# Patient Record
Sex: Female | Born: 1945 | Race: White | Hispanic: No | Marital: Married | Smoking: Never smoker
Health system: Southern US, Community
[De-identification: ages and names within clinical notes are randomized; demographics above are authoritative.]

## PROBLEM LIST (undated history)

## (undated) DIAGNOSIS — I10 Essential (primary) hypertension: Secondary | ICD-10-CM

## (undated) DIAGNOSIS — E039 Hypothyroidism, unspecified: Secondary | ICD-10-CM

## (undated) DIAGNOSIS — F32A Depression, unspecified: Secondary | ICD-10-CM

## (undated) DIAGNOSIS — K219 Gastro-esophageal reflux disease without esophagitis: Secondary | ICD-10-CM

## (undated) DIAGNOSIS — M1990 Unspecified osteoarthritis, unspecified site: Secondary | ICD-10-CM

## (undated) DIAGNOSIS — R7303 Prediabetes: Secondary | ICD-10-CM

## (undated) DIAGNOSIS — E079 Disorder of thyroid, unspecified: Secondary | ICD-10-CM

## (undated) DIAGNOSIS — F419 Anxiety disorder, unspecified: Secondary | ICD-10-CM

## (undated) HISTORY — PX: TONSILLECTOMY AND ADENOIDECTOMY: SUR1326

## (undated) HISTORY — DX: Disorder of thyroid, unspecified: E07.9

## (undated) HISTORY — DX: Gastro-esophageal reflux disease without esophagitis: K21.9

## (undated) HISTORY — PX: ESOPHAGOGASTRODUODENOSCOPY ENDOSCOPY: SHX5814

## (undated) HISTORY — PX: CARPAL TUNNEL RELEASE: SHX101

## (undated) HISTORY — PX: TUBAL LIGATION: SHX77

## (undated) HISTORY — PX: COLONOSCOPY: SHX174

## (undated) HISTORY — PX: CATARACT EXTRACTION, BILATERAL: SHX1313

## (undated) HISTORY — PX: NASAL SINUS SURGERY: SHX719

## (undated) HISTORY — PX: AXILLARY LYMPH NODE BIOPSY: SHX5737

---

## 1998-02-24 ENCOUNTER — Ambulatory Visit (HOSPITAL_COMMUNITY): Admission: RE | Admit: 1998-02-24 | Discharge: 1998-02-24 | Payer: Self-pay | Admitting: Obstetrics and Gynecology

## 1999-01-31 ENCOUNTER — Other Ambulatory Visit: Admission: RE | Admit: 1999-01-31 | Discharge: 1999-01-31 | Payer: Self-pay | Admitting: Obstetrics and Gynecology

## 1999-10-24 ENCOUNTER — Ambulatory Visit (HOSPITAL_COMMUNITY): Admission: RE | Admit: 1999-10-24 | Discharge: 1999-10-24 | Payer: Self-pay | Admitting: Obstetrics and Gynecology

## 1999-10-24 ENCOUNTER — Encounter: Payer: Self-pay | Admitting: Obstetrics and Gynecology

## 2000-03-13 ENCOUNTER — Other Ambulatory Visit: Admission: RE | Admit: 2000-03-13 | Discharge: 2000-03-13 | Payer: Self-pay | Admitting: Obstetrics and Gynecology

## 2000-07-10 ENCOUNTER — Ambulatory Visit (HOSPITAL_BASED_OUTPATIENT_CLINIC_OR_DEPARTMENT_OTHER): Admission: RE | Admit: 2000-07-10 | Discharge: 2000-07-10 | Payer: Self-pay | Admitting: Orthopedic Surgery

## 2001-03-18 ENCOUNTER — Other Ambulatory Visit: Admission: RE | Admit: 2001-03-18 | Discharge: 2001-03-18 | Payer: Self-pay | Admitting: Obstetrics and Gynecology

## 2001-05-08 ENCOUNTER — Ambulatory Visit (HOSPITAL_COMMUNITY): Admission: RE | Admit: 2001-05-08 | Discharge: 2001-05-08 | Payer: Self-pay | Admitting: Gastroenterology

## 2001-05-08 ENCOUNTER — Encounter (INDEPENDENT_AMBULATORY_CARE_PROVIDER_SITE_OTHER): Payer: Self-pay | Admitting: *Deleted

## 2002-05-06 ENCOUNTER — Other Ambulatory Visit: Admission: RE | Admit: 2002-05-06 | Discharge: 2002-05-06 | Payer: Self-pay | Admitting: Obstetrics and Gynecology

## 2002-07-30 ENCOUNTER — Ambulatory Visit (HOSPITAL_COMMUNITY): Admission: RE | Admit: 2002-07-30 | Discharge: 2002-07-30 | Payer: Self-pay | Admitting: Obstetrics and Gynecology

## 2002-07-30 ENCOUNTER — Encounter: Payer: Self-pay | Admitting: Obstetrics and Gynecology

## 2002-12-03 ENCOUNTER — Encounter: Payer: Self-pay | Admitting: Allergy and Immunology

## 2002-12-03 ENCOUNTER — Ambulatory Visit (HOSPITAL_COMMUNITY): Admission: RE | Admit: 2002-12-03 | Discharge: 2002-12-03 | Payer: Self-pay | Admitting: *Deleted

## 2003-05-29 ENCOUNTER — Other Ambulatory Visit: Admission: RE | Admit: 2003-05-29 | Discharge: 2003-05-29 | Payer: Self-pay | Admitting: Obstetrics and Gynecology

## 2003-06-08 ENCOUNTER — Encounter: Payer: Self-pay | Admitting: Obstetrics and Gynecology

## 2003-06-08 ENCOUNTER — Ambulatory Visit (HOSPITAL_COMMUNITY): Admission: RE | Admit: 2003-06-08 | Discharge: 2003-06-08 | Payer: Self-pay | Admitting: Obstetrics and Gynecology

## 2003-08-19 ENCOUNTER — Ambulatory Visit (HOSPITAL_COMMUNITY): Admission: RE | Admit: 2003-08-19 | Discharge: 2003-08-19 | Payer: Self-pay | Admitting: Obstetrics and Gynecology

## 2004-02-03 ENCOUNTER — Ambulatory Visit (HOSPITAL_COMMUNITY): Admission: RE | Admit: 2004-02-03 | Discharge: 2004-02-03 | Payer: Self-pay | Admitting: Surgery

## 2004-03-26 ENCOUNTER — Emergency Department (HOSPITAL_COMMUNITY): Admission: EM | Admit: 2004-03-26 | Discharge: 2004-03-26 | Payer: Self-pay | Admitting: *Deleted

## 2004-10-07 ENCOUNTER — Other Ambulatory Visit: Admission: RE | Admit: 2004-10-07 | Discharge: 2004-10-07 | Payer: Self-pay | Admitting: Obstetrics and Gynecology

## 2005-08-20 ENCOUNTER — Emergency Department (HOSPITAL_COMMUNITY): Admission: EM | Admit: 2005-08-20 | Discharge: 2005-08-20 | Payer: Self-pay | Admitting: Emergency Medicine

## 2005-10-24 ENCOUNTER — Other Ambulatory Visit: Admission: RE | Admit: 2005-10-24 | Discharge: 2005-10-24 | Payer: Self-pay | Admitting: Obstetrics and Gynecology

## 2006-11-19 ENCOUNTER — Ambulatory Visit (HOSPITAL_COMMUNITY): Admission: RE | Admit: 2006-11-19 | Discharge: 2006-11-19 | Payer: Self-pay | Admitting: Orthopedic Surgery

## 2009-05-24 ENCOUNTER — Encounter: Admission: RE | Admit: 2009-05-24 | Discharge: 2009-05-24 | Payer: Self-pay | Admitting: Occupational Medicine

## 2010-02-08 ENCOUNTER — Emergency Department (HOSPITAL_COMMUNITY): Admission: EM | Admit: 2010-02-08 | Discharge: 2010-02-08 | Payer: Self-pay | Admitting: Family Medicine

## 2011-03-03 NOTE — Procedures (Signed)
[place]. [facility]  Patient:    FIZA, NATION                       MRN: [id number] Proc. Date: 05/08/01 Attending:  Florencia Reasons, M.D. CC:         Dr. Nadine Counts, [place]  Juluis Mire, M.D.   Procedure Report  PROCEDURE:  Colonoscopy.  INDICATION:  A 65 year old female for colon cancer screening.  FINDINGS:  Mild melanosis coli.  Otherwise normal to terminal ileum.  DESCRIPTION OF PROCEDURE:  The nature, purpose, and risks of the procedure had been discussed with the patient, who provided written consent.  Sedation for this procedure and the upper endoscopy which preceded it totaled fentanyl 60 mcg and Versed 7 mg IV, without arrhythmias or desaturation.  The Olympus adjustable-tension pediatric video colonoscope was advanced to the terminal ileum without significant difficulty, and pullback was then performed.  The quality of the prep was excellent, and it is felt that all areas were well-seen.  The terminal ileum was normal in appearance.  The colon had mild melanosis coli, most prominent in the proximal colon.  No polyps, cancer, colitis, vascular malformations, or diverticulosis were noted.  Retroflexion in the rectum showed internal hemorrhoids, which were also somewhat visible during pull-out through the anal canal.  The patient tolerated the procedure well, and there were no apparent complications.  IMPRESSION:  Mild melanosis coli, otherwise unremarkable examination.  PLAN:  Consider follow-up screening exam of some sort (flexible sigmoidoscopy or colonoscopy) in approximately five years. DD:  05/08/01 TD:  05/08/01 Job: [id number]

## 2011-03-03 NOTE — Procedures (Signed)
[place]. [facility]  Patient:    Rhonda Blair, Rhonda Blair                       MRN: [id number] Proc. Date: 05/08/01 Attending:  Florencia Reasons, M.D. CC:         Nadine Counts, MD  Juluis Mire, M.D.   Procedure Report  PROCEDURE PERFORMED:  Upper endoscopy.  ENDOSCOPIST:  Florencia Reasons, M.D.  INDICATIONS FOR PROCEDURE:  Refractory reflux-type symptoms in a 65 year old female, not responding fully to Nexium therapy.  FINDINGS:  Normal exam.  DESCRIPTION OF PROCEDURE:  The nature, purpose and risks of the procedure had been discussed with the patient, who provided written consent.  Sedation for this procedure was fentanyl 40 mcg and Versed 5 mg IV without arrhythmias or desaturation.  The Olympus adult video endoscope was passed under direct vision.  The larynx and vocal cords looked very normal without any evidence of bogginess, edema or other inflammatory changes to suggest reflux laryngitis. The esophagus was entered under direct vision and had normal mucosa right down to the squamocolumnar junction which was located at the level of the diaphragm.  Specifically, there was no evidence of reflux esophagitis, Barretts esophagus, varices, infection or neoplasia.  I did not appreciate any ring, stricture or hiatal hernia.  There was a little bit of bumpiness of the mucosa, possibly constituting glycogen acanthosis just above the squamocolumnar junction or conceivably this could represent some sort of postinflammatory changes, so I did take biopsies from this area at the conclusion of the procedure.  The stomach contained no significant residual and had normal mucosa, without evidence of gastritis, erosions, ulcers, polyps or masses, and the pylorus, duodenal bulb and second duodenum looked normal. Random duodenal and antral biopsies were also obtained in view of the unexplained ongoing symptoms in this patient prior to removal of the scope. The  patient tolerated the procedure well and there were no apparent complications.  IMPRESSION:  Normal upper endoscopy.  No source of symptoms endoscopically evident.  PLAN: 1. Await pathology on biopsies. 2. Proceed to colonoscopic evaluation for screening purposes. DD:  05/08/01 TD:  05/08/01 Job: [id number]

## 2011-03-03 NOTE — Procedures (Signed)
[place]. [facility]  Patient:    Rhonda Blair, Rhonda Blair                     MRN: [id number] Proc. Date: 07/10/00 Adm. Date:  [id number] Attending:  Susa Day CC:         Katy Fitch. Naaman Plummer., M.D.   Procedure Report  PREOPERATIVE DIAGNOSIS:  Entrapment neuropathy, median nerve, right carpal tunnel.  POSTOPERATIVE DIAGNOSIS:  Entrapment neuropathy, median nerve, right carpal tunnel.  OPERATION PERFORMED:  Release of right transverse carpal ligament.  OPERATING SURGEON:  Josephine Igo, M.D.  ASSISTANT:  Jonni Sanger, P.A.  ANESTHESIA:  General by LMA.  ANESTHESIOLOGIST:  Dr. Krista Blue.  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. [employer].  She has had a history of hand numbness and pain.  Clinical examination revealed signs of right carpal tunnel syndrome confirmed by electrodiagnostic studies.  Due to failure of nonoperative measures, the patient is brought to the operating room at this time for release of her righit transverse carpal ligament.  DESCRIPTION OF PROCEDURE:  Rhonda Blair was brought to the operating room and placed in supine position on the operating table.  Following induction of general anesthesia by LMA, the right arm was prepped with Betadine soap and solution and sterilely draped.  Following exsanguination of the limb with an Esmarch bandage, the arterial tourniquet was inflated to 220 mmHg.  The procedure commenced with a short incision in line of the ring finger in the palm.  The subcutaneous tissues were carefully divided revealing the palmar fascia.  This was split longitudinally to reveal the common sensory branch of the median nerve.  These were followed back to the transverse carpal ligament which was carefully separated from the median nerve.  The ligament was released on its ulnar border extending into the distal forearm.  The volar  forearm fascia was likewise released with a subcutaneous pass of the scissors.  This widely opened the carpal canal.  No masses or other predicaments were noted.  Bleeding points along the margin of the released ligament were electrocauterized with bipolar current followed by repair of the skin with intradermal 3-0 Prolene suture.  0.25% Marcaine was infiltrated along the wound margins for postoperative analgesia.  A compressive dressing was applied with volar plaster splint maintaining the wrist in 15 degrees dorsiflexion.  There were no apparent complications.  The patient tolerated the surgery and anesthesia well and was transferred to the recovery room with stable vital signs.  For aftercare she was given prescription for Percocet 5/325 one or two tablets p.o. q.4-6h. p.r.n. pain.  She will return to the office and will follow up in seven to 10 days to begin a therapy program and have her suture removed.  A compressive dressing was applied with a volar plaster splint maintaining the wrist in 5 degrees dorsiflexion. DD:  07/10/00 TD:  07/10/00 Job: [id number]

## 2012-02-13 ENCOUNTER — Other Ambulatory Visit: Payer: Self-pay | Admitting: Gastroenterology

## 2012-12-12 ENCOUNTER — Other Ambulatory Visit (HOSPITAL_COMMUNITY): Payer: Self-pay | Admitting: Obstetrics and Gynecology

## 2012-12-18 ENCOUNTER — Ambulatory Visit (HOSPITAL_COMMUNITY)
Admission: RE | Admit: 2012-12-18 | Discharge: 2012-12-18 | Disposition: A | Discharge status: Home / Self Care | Payer: Medicare Other | Source: Ambulatory Visit | Attending: Obstetrics and Gynecology | Admitting: Obstetrics and Gynecology

## 2012-12-18 DIAGNOSIS — E041 Nontoxic single thyroid nodule: Secondary | ICD-10-CM | POA: Insufficient documentation

## 2013-01-08 ENCOUNTER — Encounter (INDEPENDENT_AMBULATORY_CARE_PROVIDER_SITE_OTHER): Payer: Self-pay | Admitting: Surgery

## 2013-01-08 ENCOUNTER — Ambulatory Visit (INDEPENDENT_AMBULATORY_CARE_PROVIDER_SITE_OTHER): Payer: Medicare Other | Admitting: Surgery

## 2013-01-08 VITALS — BP 136/74 | HR 72 | Temp 99.0°F | Resp 20 | Ht 65.0 in | Wt 147.0 lb

## 2013-01-08 DIAGNOSIS — E042 Nontoxic multinodular goiter: Secondary | ICD-10-CM | POA: Insufficient documentation

## 2013-01-08 NOTE — Patient Instructions (Signed)
Thyroid Biopsy The thyroid gland is a butterfly-shaped gland situated in the front of the neck. It produces hormones which affect metabolism, growth and development, and body temperature. A thyroid biopsy is a procedure in which small samples of tissue or fluid are removed from the thyroid gland or mass and examined under a microscope. This test is done to determine the cause of thyroid problems, such as infection, cancer, or other thyroid problems. There are 2 ways to obtain samples: 1. Fine needle biopsy. Samples are removed using a thin needle inserted through the skin and into the thyroid gland or mass. 2. Open biopsy. Samples are removed after a cut (incision) is made through the skin. LET YOUR CAREGIVER KNOW ABOUT:   Allergies.  Medications taken including herbs, eye drops, over-the-counter medications, and creams.  Use of steroids (by mouth or creams).  Previous problems with anesthetics or numbing medicine.  Possibility of pregnancy, if this applies.  History of blood clots (thrombophlebitis).  History of bleeding or blood problems.  Previous surgery.  Other health problems. RISKS AND COMPLICATIONS  Bleeding from the site. The risk of bleeding is higher if you have a bleeding disorder or are taking any blood thinning medications (anticoagulants).  Infection.  Injury to structures near the thyroid gland. BEFORE THE PROCEDURE  This is a procedure that can be done as an outpatient. Confirm the time that you need to arrive for your procedure. Confirm whether there is a need to fast or withhold any medications. A blood sample may be done to determine your blood clotting time. Medicine may be given to help you relax (sedative). PROCEDURE Fine needle biopsy. You will be awake during the procedure. You may be asked to lie on your back with your head tipped backward to extend your neck. Let your caregiver know if you cannot tolerate the positioning. An area on your neck will be  cleansed. A needle is inserted through the skin of your neck. You may feel a mild discomfort during this procedure. You may be asked to avoid coughing, talking, swallowing, or making sounds during some portions of the procedure. The needle is withdrawn once tissue or fluid samples have been removed. Pressure may be applied to the neck to reduce swelling and ensure that bleeding has stopped. The samples will be sent for examination.  Open biopsy. You will be given general anesthesia. You will be asleep during the procedure. An incision is made in your neck. A sample of thyroid tissue or the mass is removed. The tissue sample or mass will be sent for examination. The sample or mass may be examined during the biopsy. If the sample or mass contains cancer cells, some or all of the thyroid gland may be removed. The incision is closed with stitches. AFTER THE PROCEDURE  Your recovery will be assessed and monitored. If there are no problems, as an outpatient, you should be able to go home shortly after the procedure. If you had a fine needle biopsy:  You may have soreness at the biopsy site for 1 to 2 days. If you had an open biopsy:   You may have soreness at the biopsy site for 3 to 4 days.  You may have a hoarse voice or sore throat for 1 to 2 days. Obtaining the Test Results It is your responsibility to obtain your test results. Do not assume everything is normal if you have not heard from your caregiver or the medical facility. It is important for you to follow up   on all of your test results. HOME CARE INSTRUCTIONS   Keeping your head raised on a pillow when you are lying down may ease biopsy site discomfort.  Supporting the back of your head and neck with both hands as you sit up from a lying position may ease biopsy site discomfort.  Only take over-the-counter or prescription medicines for pain, discomfort, or fever as directed by your caregiver.  Throat lozenges or gargling with warm salt  water may help to soothe a sore throat. SEEK IMMEDIATE MEDICAL CARE IF:   You have severe bleeding from the biopsy site.  You have difficulty swallowing.  You have a fever.  You have increased pain, swelling, redness, or warmth at the biopsy site.  You notice pus coming from the biopsy site.  You have swollen glands (lymph nodes) in your neck. Document Released: 07/30/2007 Document Revised: 12/25/2011 Document Reviewed: 12/30/2008 ExitCare Patient Information 2013 [employer].  

## 2013-01-08 NOTE — Progress Notes (Signed)
General Surgery [facility]  Chief Complaint  Patient presents with  . New Evaluation    eval thyroid goiter - referral from Dr. 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 Patient was followed in this practice some years ago for thyroid nodules. She has never undergone fine-needle aspiration biopsy. At her recent physical examination, Dr. Arelia Sneddon felt that the nodules were increased in size.  She underwent thyroid ultrasound on March the 50,014. This showed an enlarged thyroid gland with the right lobe measuring 6.1 cm in the left lobe measuring 5.7 cm. Multiple nodules were present bilaterally. Dominant nodule on the right measured 1.9 cm and small calcifications were present. Dominant nodule on the left was in the midportion of the lobe measuring 2.4 cm. There had been interval enlargement since her prior study. The radiologist favor of this being a multinodular goiter.  Patient has had mild compressive symptoms with occasional dysphagia. She denies globus sensation. She has had no prior thyroid surgery. There is a family history of unspecified thyroid disease.  Past Medical History  Diagnosis Date  . GERD (gastroesophageal reflux disease)   . Thyroid disease      Current Outpatient Prescriptions  Medication Sig Dispense Refill  . calcium-vitamin D 250-100 MG-UNIT per tablet Take 1 tablet by mouth 3 (three) times daily.      . citalopram (CELEXA) 40 MG tablet Take 40 mg by mouth daily.      Marland Kitchen conjugated estrogens (PREMARIN) vaginal cream Place 1 g vaginally daily.      . famotidine (PEPCID) 40 MG tablet Take 40 mg by mouth daily.      . fluticasone (VERAMYST) 27.5 MCG/SPRAY nasal spray Place 2 sprays into the nose daily.      Marland Kitchen levothyroxine (SYNTHROID, LEVOTHROID) 50 MCG tablet Take 50 mcg by mouth daily.      . meloxicam (MOBIC) 7.5  MG tablet Take 7.5 mg by mouth daily.       No current facility-administered medications for this visit.     Allergies  Allergen Reactions  . Pneumococcal Vaccines      Family History  Problem Relation Age of Onset  . Heart disease Mother   . Hypertension Mother   . Hypertension Father   . Heart disease Father      History   Social History  . Marital Status: Married    Spouse Name: N/A    Number of Children: N/A  . Years of Education: N/A   Social History Main Topics  . Smoking status: Never Smoker   . Smokeless tobacco: None  . Alcohol Use: No  . Drug Use: No  . Sexually Active: None   Other Topics Concern  . None   Social History Narrative  . None     REVIEW OF SYSTEMS - PERTINENT POSITIVES ONLY: Denies tremor. Denies palpitations. Denies dyspnea. Occasional dysphagia. Denies globus sensation.  EXAM: Filed Vitals:   01/08/13 1108  BP: 136/74  Pulse: 72  Temp: 99 F (37.2 C)  Resp: 20    HEENT: normocephalic; pupils equal and reactive; sclerae clear; dentition good; mucous membranes moist NECK:  Palpable 2 cm nodule right inferior lobe, smooth, mobile; slight fullness left thyroid lobe without discrete or dominant mass; asymmetric on extension; no palpable anterior or posterior cervical lymphadenopathy; no supraclavicular masses; no tenderness CHEST: clear to auscultation bilaterally without  rales, rhonchi, or wheezes CARDIAC: regular rate and rhythm without significant murmur; peripheral pulses are full EXT:  non-tender without edema; no deformity NEURO: no gross focal deficits; no sign of tremor   LABORATORY RESULTS: See [facility] (CHL-Epic) for most recent results   RADIOLOGY RESULTS: See [facility] (CHL-Epic) for most recent results   IMPRESSION: #1 multinodular thyroid goiter, slight interval enlargement since prior studies #2 dominant nodule right thyroid lobe, 1.9 cm, with calcification #3 dominant left thyroid nodule, 2.4  cm, complex  PLAN: The patient and I reviewed her ultrasound report at length. She has never undergone fine-needle aspiration biopsy. I am going to recommend that the dominant nodules are biopsied bilaterally. We will make arrangements for that study in the near future.  I will contact the patient with the results of her cytopathology. If this indeed is a benign multinodular goiter, then she will return in 1 year with repeat thyroid ultrasound. Certainly if there is any evidence of atypia or malignancy on her biopsy, then she will return immediately to discuss possible surgical intervention.  Velora Heckler, MD, FACS General & Endocrine Surgery [facility]   Visit Diagnoses: 1. Multinodular goiter (nontoxic)     Primary Care Physician: Everlean Cherry, MD

## 2013-01-09 ENCOUNTER — Ambulatory Visit
Admission: RE | Admit: 2013-01-09 | Discharge: 2013-01-09 | Disposition: A | Discharge status: Home / Self Care | Payer: Medicare Other | Source: Ambulatory Visit | Attending: Surgery | Admitting: Surgery

## 2013-01-09 ENCOUNTER — Other Ambulatory Visit (HOSPITAL_COMMUNITY)
Admission: RE | Admit: 2013-01-09 | Discharge: 2013-01-09 | Disposition: A | Discharge status: Home / Self Care | Payer: Medicare Other | Source: Ambulatory Visit | Attending: Interventional Radiology | Admitting: Interventional Radiology

## 2013-01-09 DIAGNOSIS — E042 Nontoxic multinodular goiter: Secondary | ICD-10-CM

## 2013-01-09 DIAGNOSIS — E041 Nontoxic single thyroid nodule: Secondary | ICD-10-CM | POA: Insufficient documentation

## 2013-01-15 ENCOUNTER — Other Ambulatory Visit (INDEPENDENT_AMBULATORY_CARE_PROVIDER_SITE_OTHER): Payer: Self-pay

## 2013-01-15 ENCOUNTER — Telehealth (INDEPENDENT_AMBULATORY_CARE_PROVIDER_SITE_OTHER): Payer: Self-pay

## 2013-01-15 DIAGNOSIS — E042 Nontoxic multinodular goiter: Secondary | ICD-10-CM

## 2013-01-15 NOTE — Telephone Encounter (Signed)
Pt notified of u/s result and f/u March 2015 with u/s and ov per Dr Gerrit Friends. U/S scheduled and date mailed to pt.

## 2013-02-10 ENCOUNTER — Encounter (INDEPENDENT_AMBULATORY_CARE_PROVIDER_SITE_OTHER): Payer: Self-pay

## 2013-12-15 ENCOUNTER — Ambulatory Visit
Admission: RE | Admit: 2013-12-15 | Discharge: 2013-12-15 | Disposition: A | Discharge status: Home / Self Care | Payer: Commercial Managed Care - HMO | Source: Ambulatory Visit | Attending: Surgery | Admitting: Surgery

## 2013-12-15 DIAGNOSIS — E042 Nontoxic multinodular goiter: Secondary | ICD-10-CM

## 2014-07-08 IMAGING — US US THYROID BIOPSY
1 series · 13 of 14 positions shown · non-contrast
Comparison: none

INDICATION: Indeterminate thyroid nodules

[Series 1: us thyroid biopsy · 0.06mm/px · 14 acquisitions, 13 frames shown]
[im 1/14]
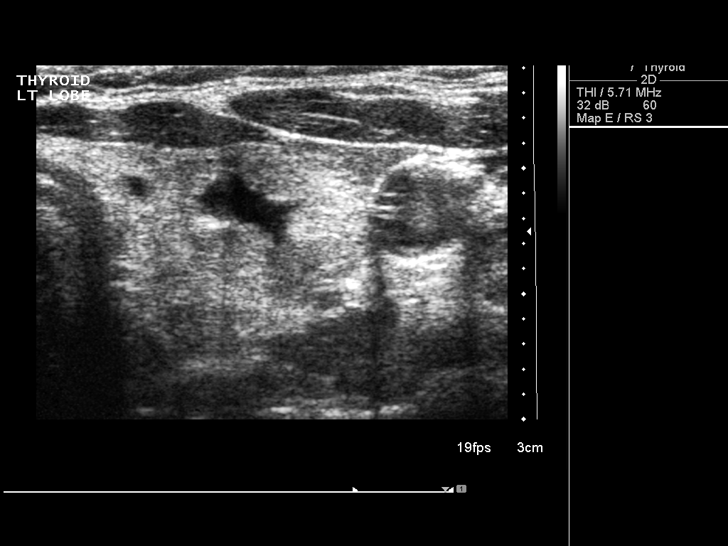
[im 2/14]
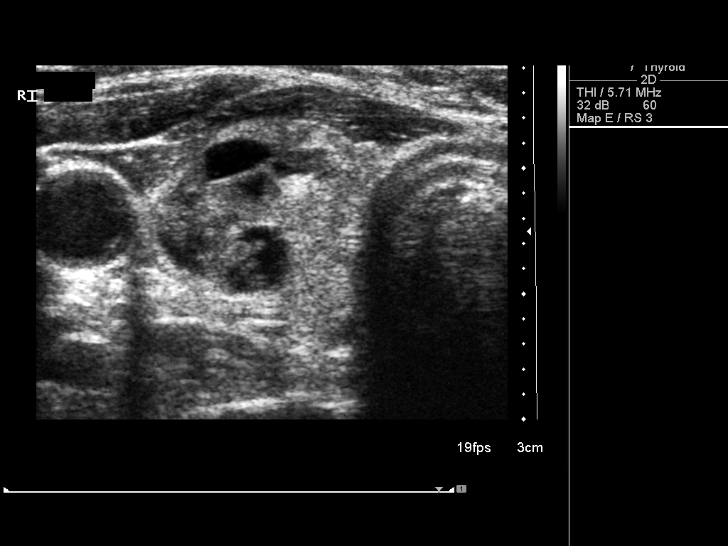
[im 3/14]
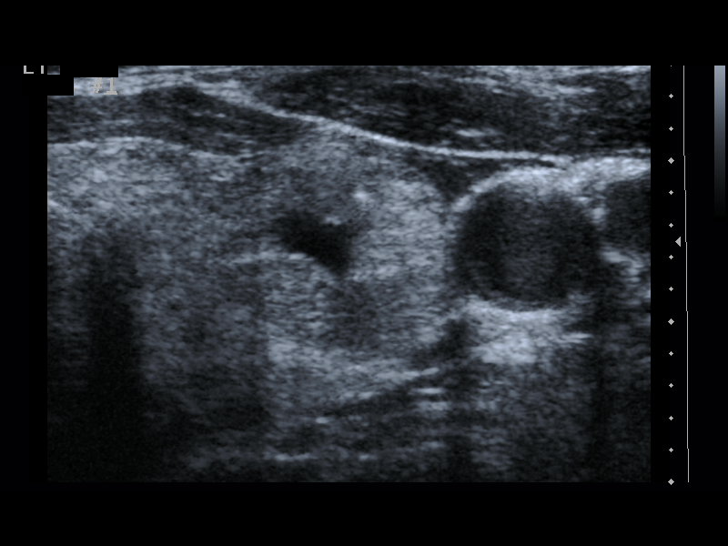
[im 4/14]
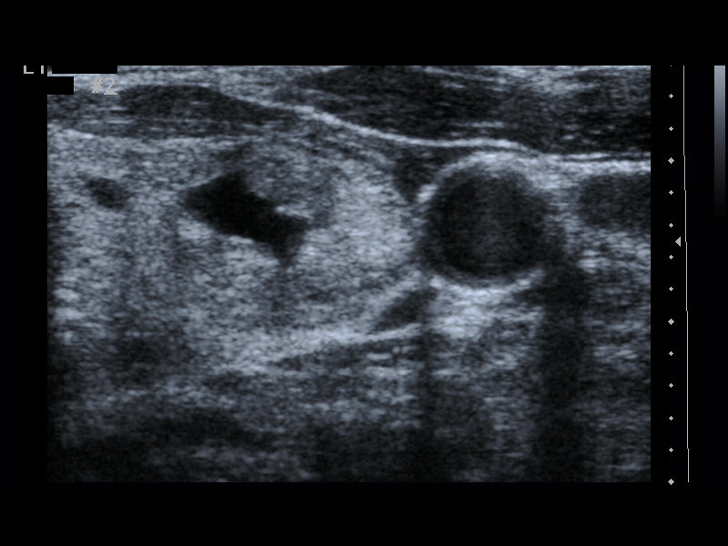
[im 5/14]
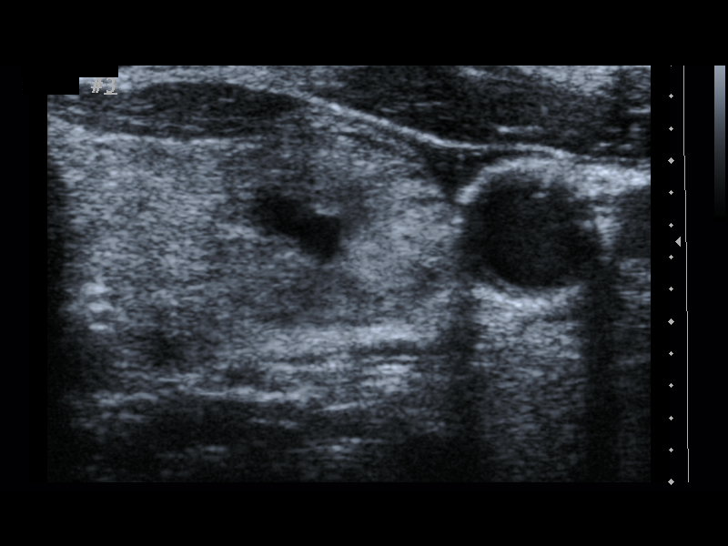
[im 6/14]
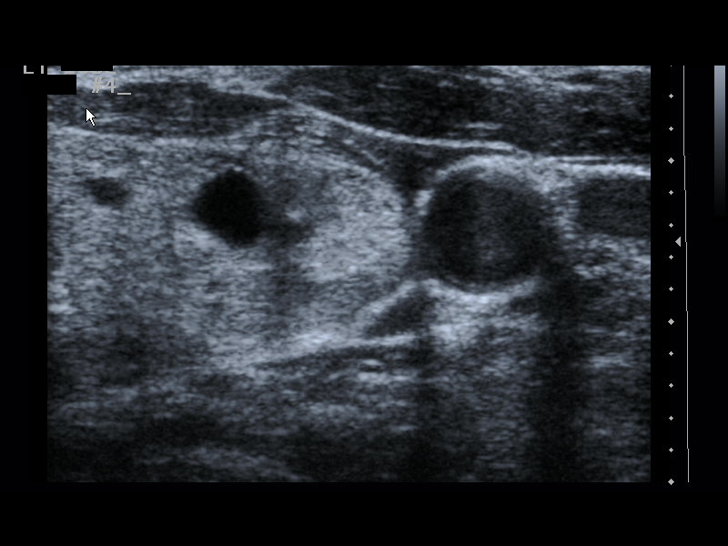
[im 8/14]
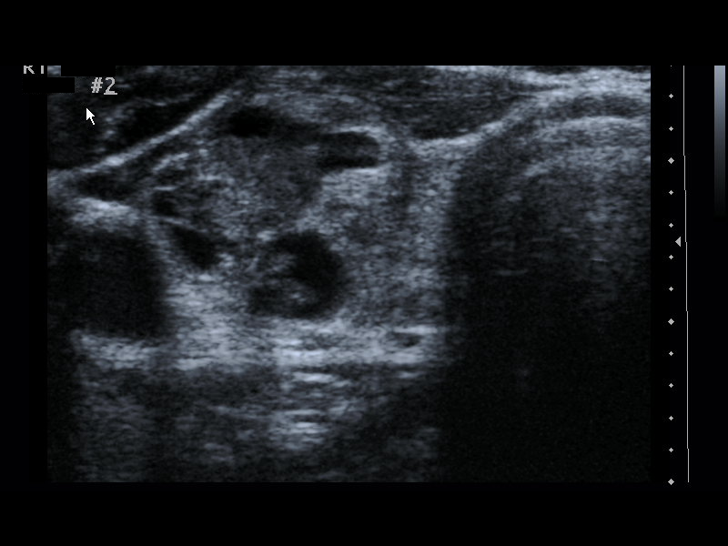
[im 9/14]
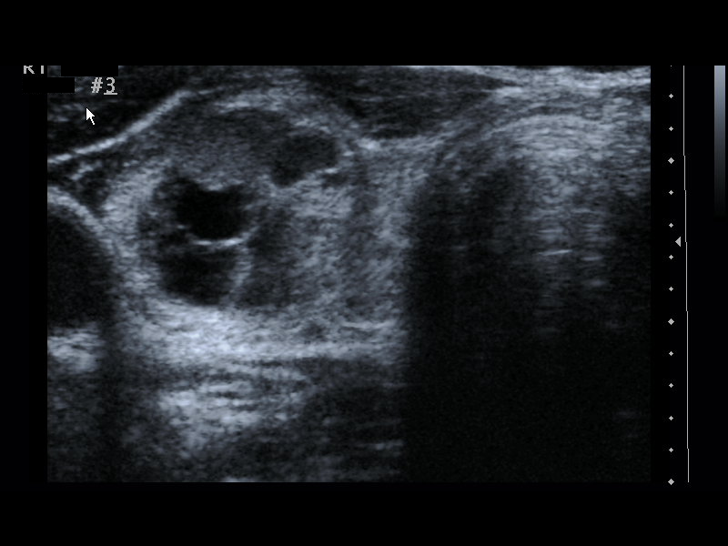
[im 10/14]
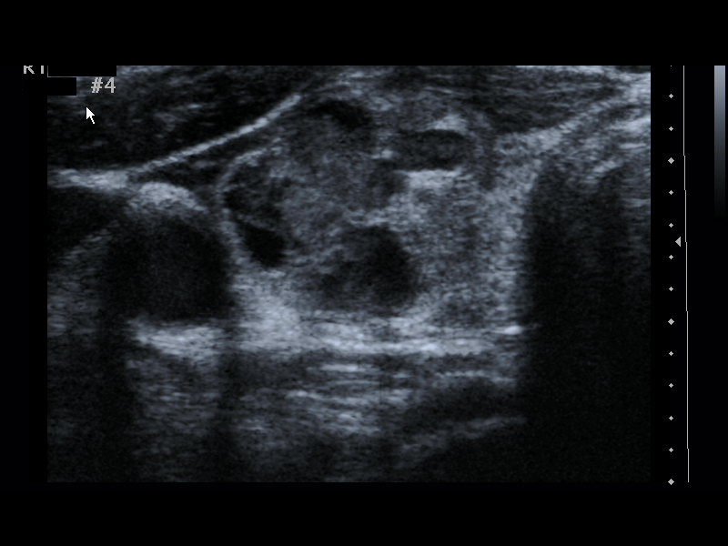
[im 11/14]
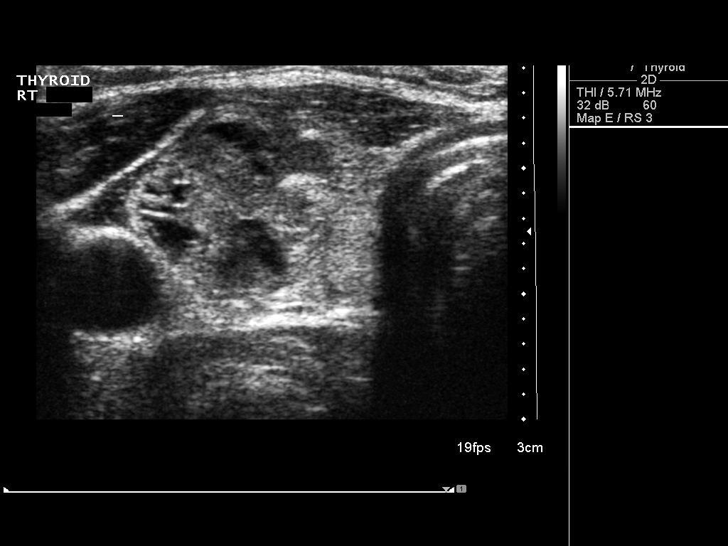
[im 12/14]
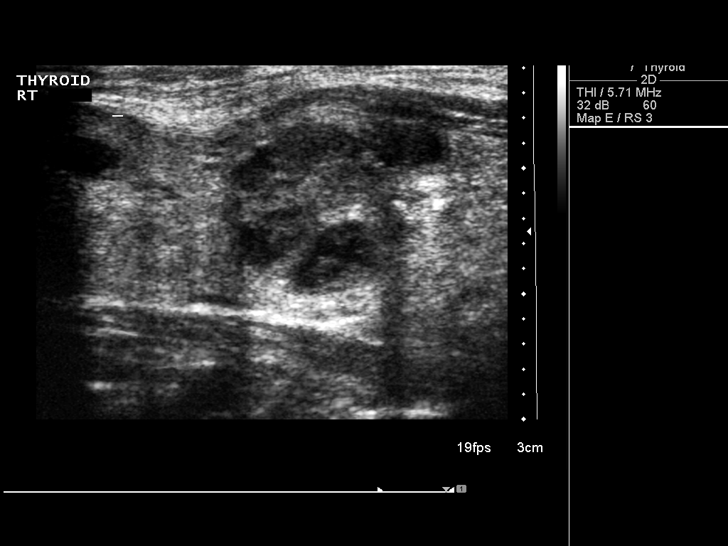
[im 13/14]
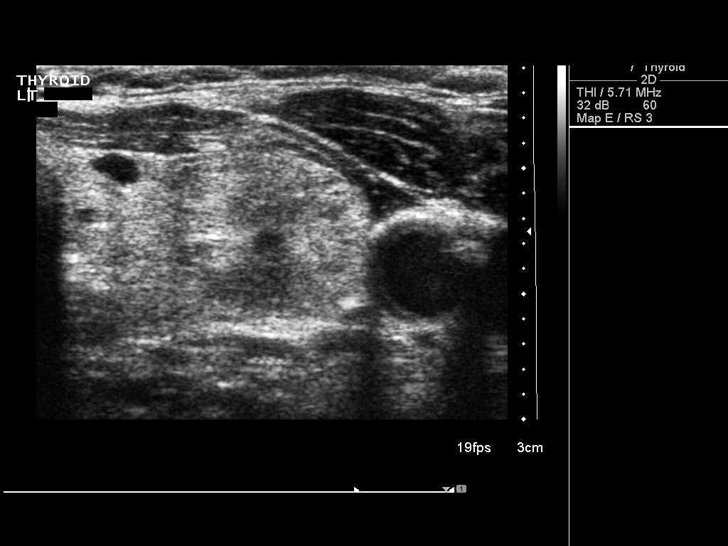
[im 14/14]
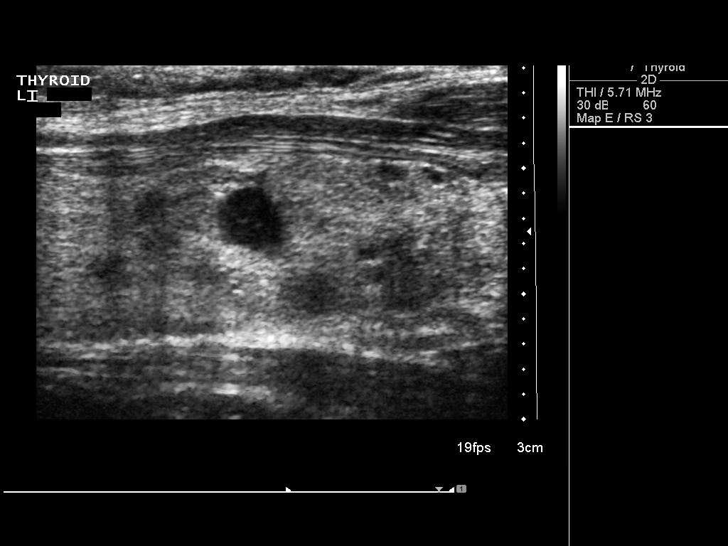

[13 of 14 positions shown; findings below may reference images not displayed]

ULTRASOUND GUIDED THYROID FINE NEEDLE ASPIRATION X2

Comparisons: Thyroid Ultrasound - 12/18/2012

Intravenous Medications: None

Complications: None immediate

Technique / Findings:

Informed written consent was obtained from the patient after a
discussion of the risks, benefits and alternatives to treatment.
Questions regarding the procedure were encouraged and answered.  A
timeout was performed prior to the initiation of the procedure.

Pre-procedural ultrasound scanning demonstrated grossly unchanged
appearance of the dominant mixed echogenic, complex nodule within
the inferior aspect the right lobe of the thyroid as well as the
partially cystic, predominantly solid nodule within the mid aspect
of the left lobe of the thyroid.

The procedures were planned.  The neck was prepped in the usual
sterile fashion, and a sterile drape was applied covering the
operative field.  A timeout was performed prior to the initiation
of the procedure.  Local anesthesia was provided with 1% lidocaine.

Under direct ultrasound guidance, 4 FNA biopsies were performed of
the dominant left-sided thyroid nodule with a 25 gauge needle.  The
samples were prepared and submitted to pathology.

Under direct ultrasound guidance, 4 FNA biopsies were performed of
the dominant right-sided thyroid nodule with a 25 gauge needle.
The samples were prepared and submitted to pathology.

Limited post procedural scanning was negative for hematoma or
additional complication.   Dressings were placed.  The patient
tolerated the above procedures procedure well without immediate
postprocedural complication.
IMPRESSION: Technically successful ultrasound guided fine needle aspiration of
dominant bilateral indeterminate thyroid nodules.

## 2015-06-13 IMAGING — US US SOFT TISSUE HEAD/NECK
1 series · 13 of 25 positions shown · non-contrast
Comparison: US SOFT TISSUE HEAD/NECK dated 12/18/2012; US SOFT TISSUE
HEAD/NECK dated 02/03/2004

CLINICAL DATA: History of multinodular thyroid goiter and status
post ultrasound-guided biopsy of bilateral dominant thyroid nodules
on 01/09/2013.

EXAM:
THYROID ULTRASOUND
TECHNIQUE: Ultrasound examination of the thyroid gland and adjacent soft
tissues was performed.

[Series 1: us soft tissue head/neck · 0.08mm/px · 13 of 67 slices shown]
[im 1/67]
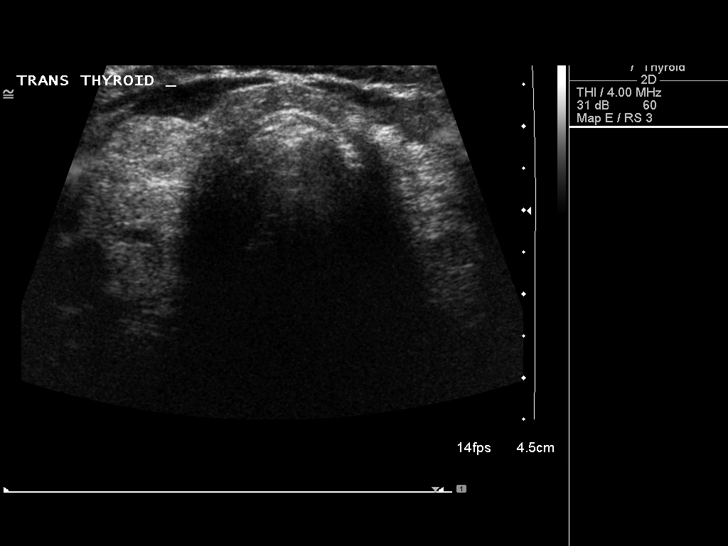
[im 6/67]
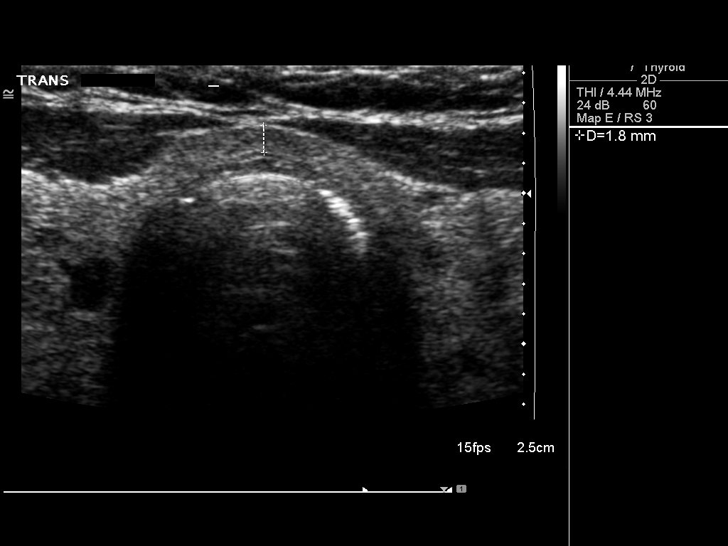
[im 12/67]
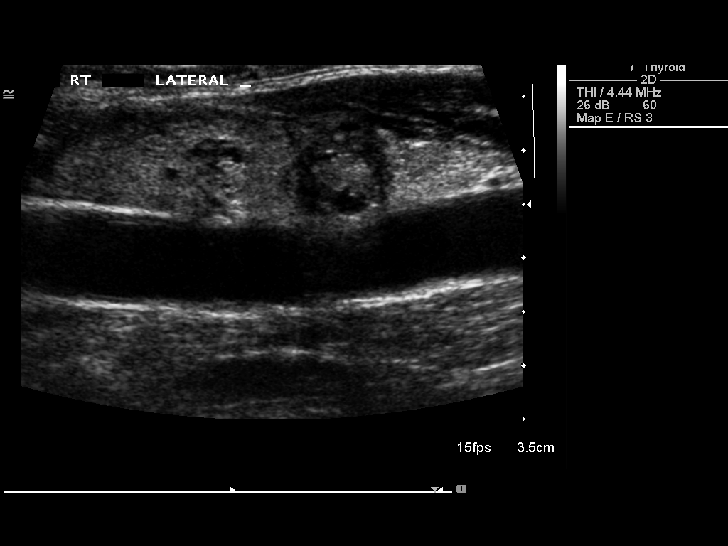
[im 17/67]
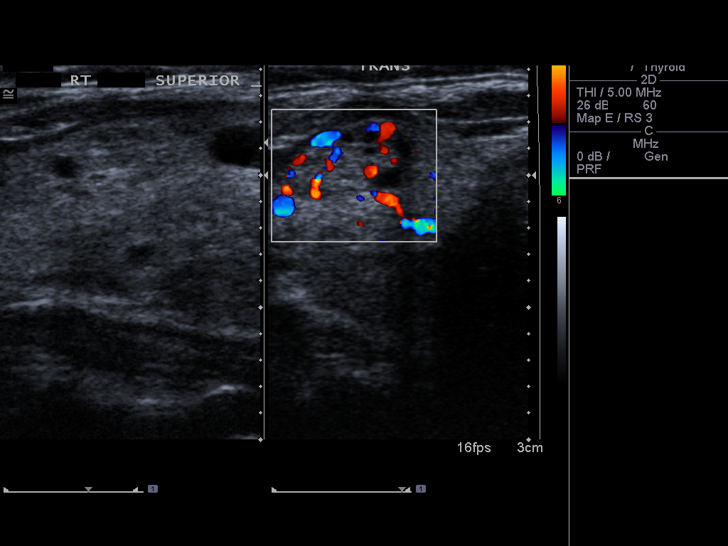
[im 23/67]
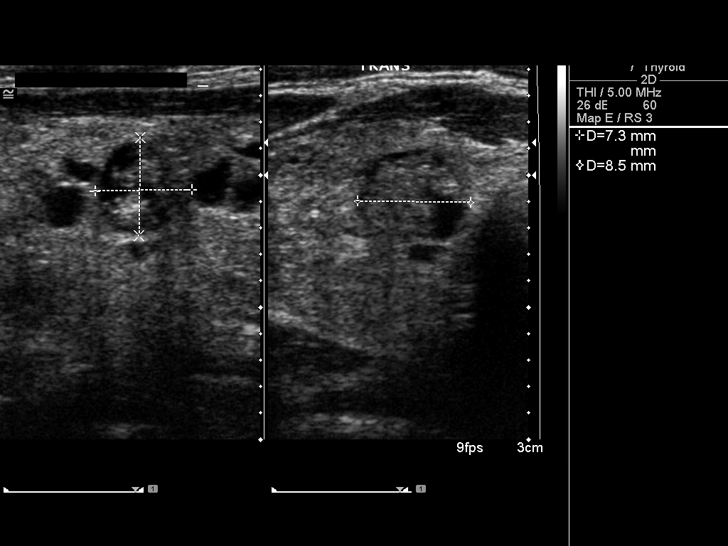
[im 28/67]
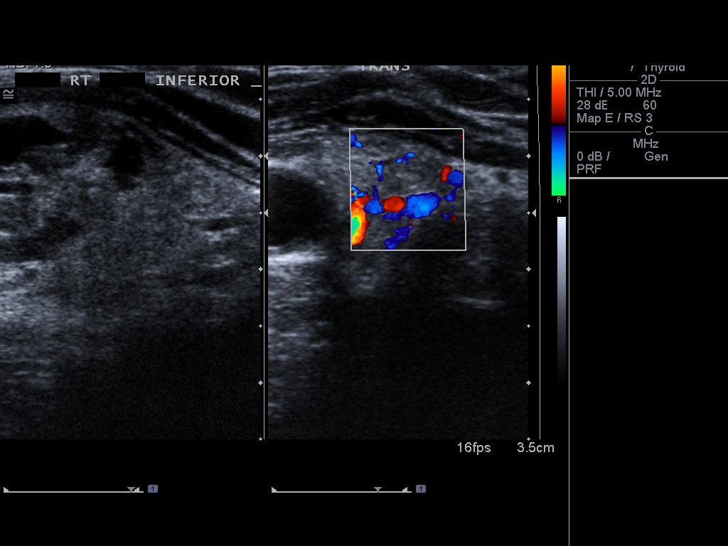
[im 34/67]
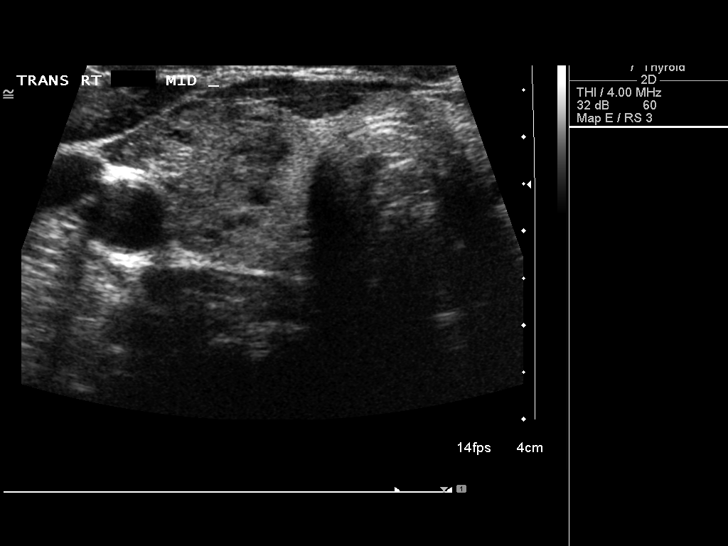
[im 39/67]
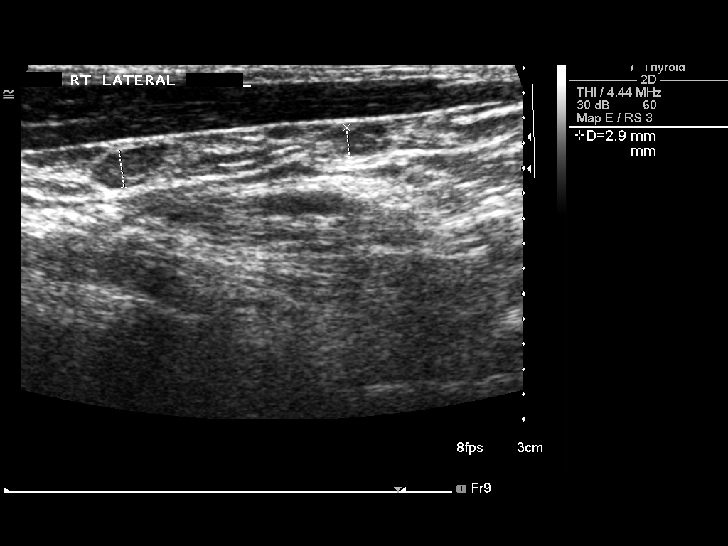
[im 45/67]
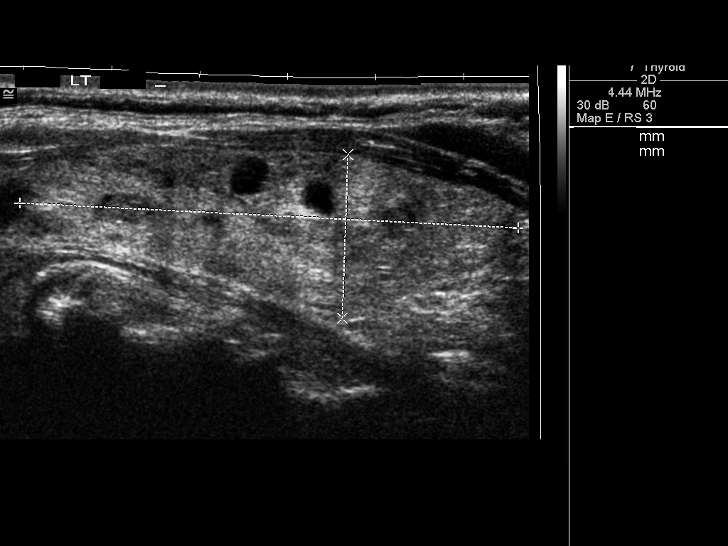
[im 50/67]
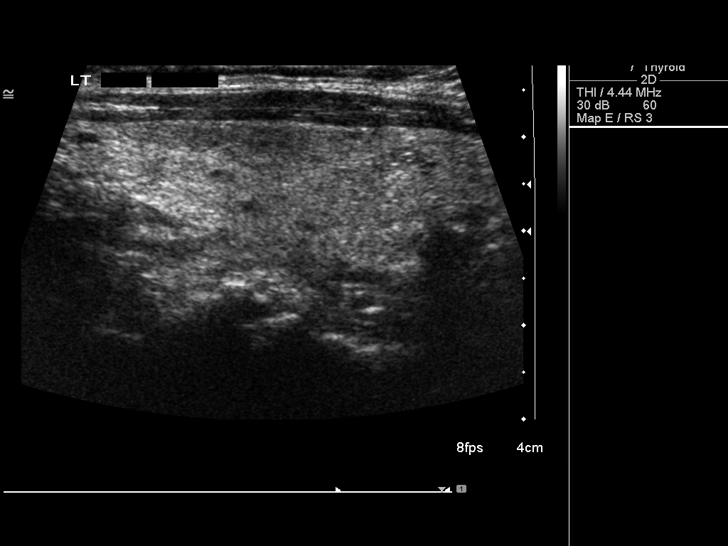
[im 56/67]
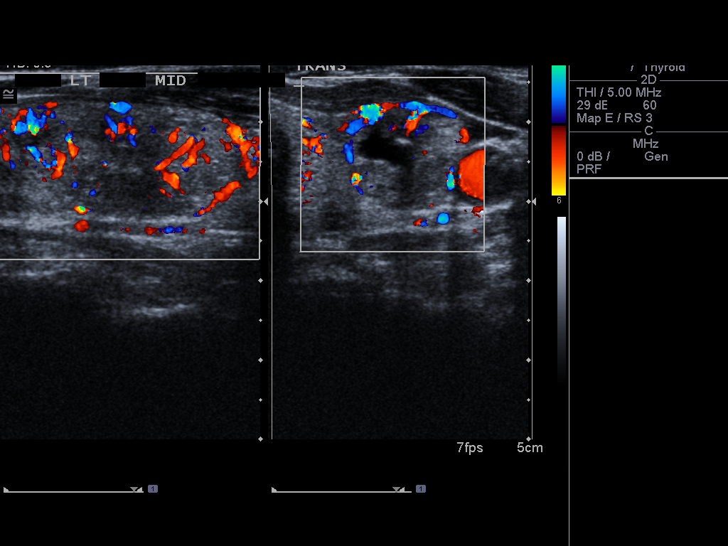
[im 61/67]
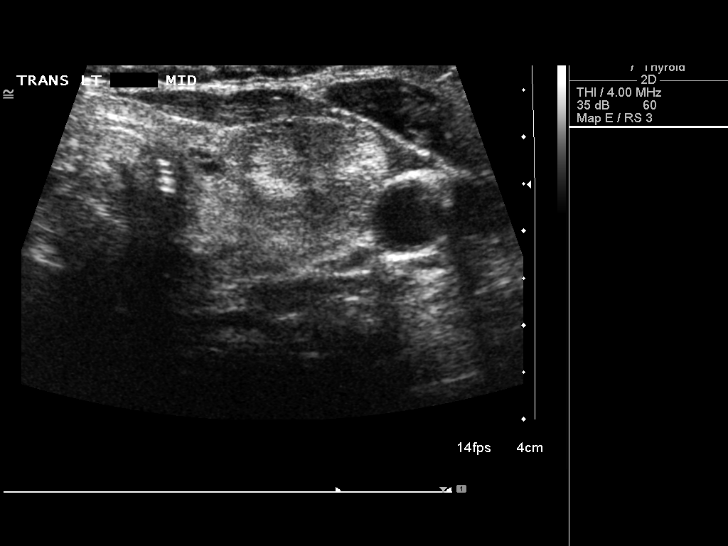
[im 67/67]
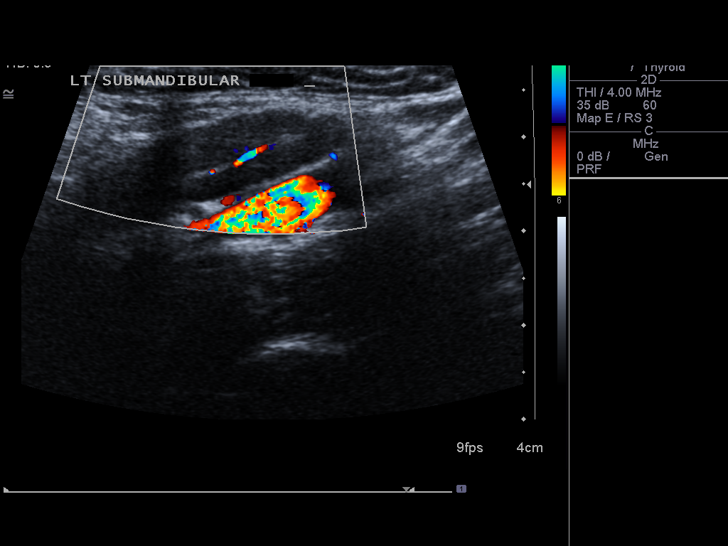

[13 of 25 positions shown; findings below may reference images not displayed]

FINDINGS: Right thyroid lobe

Measurements: 6.6 x 2.0 x 1.8 cm. Stable multinodular appearance
with dominant inferior nodule measuring 2.0 x 1.6 x 1.6 cm. This has
a stable appearance. Other subcentimeter nodules and cysts are also
stable.

Left thyroid lobe

Measurements: 5.7 x 1.9 x 2.2 cm. Stable multinodular appearance
with dominant inferior nodule measuring 2.9 x 1.4 x 1.6 cm. Maximal
length of this nodule measures slightly greater, but this is felt to
be due to how the maximal length of the nodule was measured compared
to the prior ultrasound where the nodule was more foreshortened.
When making a length measurement on the prior ultrasound study
imaging today based on vascular margination of the nodule, a similar
length of 2.8 cm is obtained. Other subcentimeter cystic areas are
stable.

Isthmus

Thickness: 0.2 cm.  No nodules visualized.

Lymphadenopathy

None visualized.
IMPRESSION: Stable multinodular thyroid goiter. As above, maximal length
measurement of the left nodule is felt to be stable based on
differences in how the nodule was measured.

## 2015-11-02 DIAGNOSIS — H26493 Other secondary cataract, bilateral: Secondary | ICD-10-CM | POA: Diagnosis not present

## 2015-11-02 DIAGNOSIS — H35033 Hypertensive retinopathy, bilateral: Secondary | ICD-10-CM | POA: Diagnosis not present

## 2015-11-02 DIAGNOSIS — H524 Presbyopia: Secondary | ICD-10-CM | POA: Diagnosis not present

## 2015-11-02 DIAGNOSIS — Z961 Presence of intraocular lens: Secondary | ICD-10-CM | POA: Diagnosis not present

## 2015-12-22 DIAGNOSIS — Z9889 Other specified postprocedural states: Secondary | ICD-10-CM | POA: Diagnosis not present

## 2015-12-22 DIAGNOSIS — M1711 Unilateral primary osteoarthritis, right knee: Secondary | ICD-10-CM | POA: Diagnosis not present

## 2016-01-03 DIAGNOSIS — J111 Influenza due to unidentified influenza virus with other respiratory manifestations: Secondary | ICD-10-CM | POA: Diagnosis not present

## 2016-01-11 DIAGNOSIS — J111 Influenza due to unidentified influenza virus with other respiratory manifestations: Secondary | ICD-10-CM | POA: Diagnosis not present

## 2016-01-14 DIAGNOSIS — J1008 Influenza due to other identified influenza virus with other specified pneumonia: Secondary | ICD-10-CM | POA: Diagnosis not present

## 2016-02-04 DIAGNOSIS — J0100 Acute maxillary sinusitis, unspecified: Secondary | ICD-10-CM | POA: Diagnosis not present

## 2016-02-04 DIAGNOSIS — J301 Allergic rhinitis due to pollen: Secondary | ICD-10-CM | POA: Diagnosis not present

## 2016-03-20 DIAGNOSIS — Z298 Encounter for other specified prophylactic measures: Secondary | ICD-10-CM | POA: Diagnosis not present

## 2016-05-11 DIAGNOSIS — Z9889 Other specified postprocedural states: Secondary | ICD-10-CM | POA: Diagnosis not present

## 2016-05-11 DIAGNOSIS — M1711 Unilateral primary osteoarthritis, right knee: Secondary | ICD-10-CM | POA: Diagnosis not present

## 2016-05-17 DIAGNOSIS — Z124 Encounter for screening for malignant neoplasm of cervix: Secondary | ICD-10-CM | POA: Diagnosis not present

## 2016-05-17 DIAGNOSIS — Z01419 Encounter for gynecological examination (general) (routine) without abnormal findings: Secondary | ICD-10-CM | POA: Diagnosis not present

## 2016-05-17 DIAGNOSIS — Z1231 Encounter for screening mammogram for malignant neoplasm of breast: Secondary | ICD-10-CM | POA: Diagnosis not present

## 2016-05-17 DIAGNOSIS — Z6824 Body mass index (BMI) 24.0-24.9, adult: Secondary | ICD-10-CM | POA: Diagnosis not present

## 2016-06-12 ENCOUNTER — Other Ambulatory Visit: Payer: Self-pay | Admitting: Obstetrics and Gynecology

## 2016-06-12 DIAGNOSIS — E041 Nontoxic single thyroid nodule: Secondary | ICD-10-CM

## 2016-06-15 ENCOUNTER — Ambulatory Visit
Admission: RE | Admit: 2016-06-15 | Discharge: 2016-06-15 | Disposition: A | Discharge status: Home / Self Care | Payer: Commercial Managed Care - HMO | Source: Ambulatory Visit | Attending: Obstetrics and Gynecology | Admitting: Obstetrics and Gynecology

## 2016-06-15 DIAGNOSIS — E042 Nontoxic multinodular goiter: Secondary | ICD-10-CM | POA: Diagnosis not present

## 2016-06-15 DIAGNOSIS — E041 Nontoxic single thyroid nodule: Secondary | ICD-10-CM

## 2016-06-23 DIAGNOSIS — E042 Nontoxic multinodular goiter: Secondary | ICD-10-CM | POA: Diagnosis not present

## 2016-06-23 DIAGNOSIS — E039 Hypothyroidism, unspecified: Secondary | ICD-10-CM | POA: Diagnosis not present

## 2016-06-27 ENCOUNTER — Other Ambulatory Visit: Payer: Self-pay | Admitting: Surgery

## 2016-06-27 DIAGNOSIS — E041 Nontoxic single thyroid nodule: Secondary | ICD-10-CM

## 2016-07-04 ENCOUNTER — Other Ambulatory Visit (HOSPITAL_COMMUNITY)
Admission: RE | Admit: 2016-07-04 | Discharge: 2016-07-04 | Disposition: A | Discharge status: Home / Self Care | Payer: PPO | Source: Ambulatory Visit | Attending: Physician Assistant | Admitting: Physician Assistant

## 2016-07-04 ENCOUNTER — Ambulatory Visit
Admission: RE | Admit: 2016-07-04 | Discharge: 2016-07-04 | Disposition: A | Discharge status: Home / Self Care | Payer: Self-pay | Source: Ambulatory Visit | Attending: Surgery | Admitting: Surgery

## 2016-07-04 DIAGNOSIS — E041 Nontoxic single thyroid nodule: Secondary | ICD-10-CM

## 2016-07-10 DIAGNOSIS — I10 Essential (primary) hypertension: Secondary | ICD-10-CM | POA: Diagnosis not present

## 2016-07-10 DIAGNOSIS — Z79899 Other long term (current) drug therapy: Secondary | ICD-10-CM | POA: Diagnosis not present

## 2016-07-10 DIAGNOSIS — T464X5A Adverse effect of angiotensin-converting-enzyme inhibitors, initial encounter: Secondary | ICD-10-CM | POA: Diagnosis not present

## 2016-07-10 DIAGNOSIS — R05 Cough: Secondary | ICD-10-CM | POA: Diagnosis not present

## 2016-07-10 DIAGNOSIS — J301 Allergic rhinitis due to pollen: Secondary | ICD-10-CM | POA: Diagnosis not present

## 2016-08-17 DIAGNOSIS — M1711 Unilateral primary osteoarthritis, right knee: Secondary | ICD-10-CM | POA: Diagnosis not present

## 2016-09-13 DIAGNOSIS — M79609 Pain in unspecified limb: Secondary | ICD-10-CM | POA: Diagnosis not present

## 2016-09-13 DIAGNOSIS — E559 Vitamin D deficiency, unspecified: Secondary | ICD-10-CM | POA: Diagnosis not present

## 2016-09-13 DIAGNOSIS — Z01818 Encounter for other preprocedural examination: Secondary | ICD-10-CM | POA: Diagnosis not present

## 2016-09-13 DIAGNOSIS — J439 Emphysema, unspecified: Secondary | ICD-10-CM | POA: Diagnosis not present

## 2016-09-13 DIAGNOSIS — Z79899 Other long term (current) drug therapy: Secondary | ICD-10-CM | POA: Diagnosis not present

## 2016-09-13 DIAGNOSIS — R52 Pain, unspecified: Secondary | ICD-10-CM | POA: Diagnosis not present

## 2016-09-21 DIAGNOSIS — M1712 Unilateral primary osteoarthritis, left knee: Secondary | ICD-10-CM | POA: Diagnosis not present

## 2016-09-21 DIAGNOSIS — Z79899 Other long term (current) drug therapy: Secondary | ICD-10-CM | POA: Diagnosis not present

## 2016-09-21 DIAGNOSIS — Z23 Encounter for immunization: Secondary | ICD-10-CM | POA: Diagnosis not present

## 2016-09-21 DIAGNOSIS — F411 Generalized anxiety disorder: Secondary | ICD-10-CM | POA: Diagnosis not present

## 2016-09-21 DIAGNOSIS — K219 Gastro-esophageal reflux disease without esophagitis: Secondary | ICD-10-CM | POA: Diagnosis not present

## 2016-09-21 DIAGNOSIS — I10 Essential (primary) hypertension: Secondary | ICD-10-CM | POA: Diagnosis not present

## 2016-09-25 DIAGNOSIS — Z23 Encounter for immunization: Secondary | ICD-10-CM | POA: Diagnosis not present

## 2016-10-01 DIAGNOSIS — J0100 Acute maxillary sinusitis, unspecified: Secondary | ICD-10-CM | POA: Diagnosis not present

## 2016-10-01 DIAGNOSIS — J209 Acute bronchitis, unspecified: Secondary | ICD-10-CM | POA: Diagnosis not present

## 2016-10-01 DIAGNOSIS — J0110 Acute frontal sinusitis, unspecified: Secondary | ICD-10-CM | POA: Diagnosis not present

## 2016-10-02 DIAGNOSIS — M1711 Unilateral primary osteoarthritis, right knee: Secondary | ICD-10-CM | POA: Diagnosis not present

## 2016-10-02 DIAGNOSIS — A4901 Methicillin susceptible Staphylococcus aureus infection, unspecified site: Secondary | ICD-10-CM | POA: Diagnosis not present

## 2016-10-02 DIAGNOSIS — Z01818 Encounter for other preprocedural examination: Secondary | ICD-10-CM | POA: Diagnosis not present

## 2016-10-16 HISTORY — PX: TOTAL KNEE ARTHROPLASTY: SHX125

## 2016-10-24 DIAGNOSIS — Z471 Aftercare following joint replacement surgery: Secondary | ICD-10-CM | POA: Diagnosis not present

## 2016-10-24 DIAGNOSIS — Z96651 Presence of right artificial knee joint: Secondary | ICD-10-CM | POA: Diagnosis not present

## 2016-10-24 DIAGNOSIS — Z79899 Other long term (current) drug therapy: Secondary | ICD-10-CM | POA: Diagnosis not present

## 2016-10-24 DIAGNOSIS — M1711 Unilateral primary osteoarthritis, right knee: Secondary | ICD-10-CM | POA: Diagnosis not present

## 2016-10-24 DIAGNOSIS — F418 Other specified anxiety disorders: Secondary | ICD-10-CM | POA: Diagnosis not present

## 2016-10-24 DIAGNOSIS — K219 Gastro-esophageal reflux disease without esophagitis: Secondary | ICD-10-CM | POA: Diagnosis not present

## 2016-10-24 DIAGNOSIS — Z888 Allergy status to other drugs, medicaments and biological substances status: Secondary | ICD-10-CM | POA: Diagnosis not present

## 2016-10-24 DIAGNOSIS — I10 Essential (primary) hypertension: Secondary | ICD-10-CM | POA: Diagnosis not present

## 2016-10-24 DIAGNOSIS — Z887 Allergy status to serum and vaccine status: Secondary | ICD-10-CM | POA: Diagnosis not present

## 2016-10-24 DIAGNOSIS — R05 Cough: Secondary | ICD-10-CM | POA: Diagnosis not present

## 2016-10-24 DIAGNOSIS — R110 Nausea: Secondary | ICD-10-CM | POA: Diagnosis not present

## 2016-10-24 DIAGNOSIS — E871 Hypo-osmolality and hyponatremia: Secondary | ICD-10-CM | POA: Diagnosis not present

## 2016-10-24 DIAGNOSIS — E039 Hypothyroidism, unspecified: Secondary | ICD-10-CM | POA: Diagnosis not present

## 2016-10-26 DIAGNOSIS — R110 Nausea: Secondary | ICD-10-CM | POA: Diagnosis not present

## 2016-10-26 DIAGNOSIS — R05 Cough: Secondary | ICD-10-CM | POA: Diagnosis not present

## 2016-10-26 DIAGNOSIS — I10 Essential (primary) hypertension: Secondary | ICD-10-CM | POA: Diagnosis not present

## 2016-10-27 DIAGNOSIS — Z471 Aftercare following joint replacement surgery: Secondary | ICD-10-CM | POA: Diagnosis not present

## 2016-10-27 DIAGNOSIS — F329 Major depressive disorder, single episode, unspecified: Secondary | ICD-10-CM | POA: Diagnosis not present

## 2016-10-27 DIAGNOSIS — I10 Essential (primary) hypertension: Secondary | ICD-10-CM | POA: Diagnosis not present

## 2016-10-27 DIAGNOSIS — Z96651 Presence of right artificial knee joint: Secondary | ICD-10-CM | POA: Diagnosis not present

## 2016-10-27 DIAGNOSIS — Z7982 Long term (current) use of aspirin: Secondary | ICD-10-CM | POA: Diagnosis not present

## 2016-11-07 DIAGNOSIS — M6281 Muscle weakness (generalized): Secondary | ICD-10-CM | POA: Diagnosis not present

## 2016-11-07 DIAGNOSIS — Z471 Aftercare following joint replacement surgery: Secondary | ICD-10-CM | POA: Diagnosis not present

## 2016-11-07 DIAGNOSIS — R2689 Other abnormalities of gait and mobility: Secondary | ICD-10-CM | POA: Diagnosis not present

## 2016-11-07 DIAGNOSIS — Z96651 Presence of right artificial knee joint: Secondary | ICD-10-CM | POA: Diagnosis not present

## 2016-11-09 DIAGNOSIS — Z471 Aftercare following joint replacement surgery: Secondary | ICD-10-CM | POA: Diagnosis not present

## 2016-11-09 DIAGNOSIS — R2689 Other abnormalities of gait and mobility: Secondary | ICD-10-CM | POA: Diagnosis not present

## 2016-11-09 DIAGNOSIS — Z96651 Presence of right artificial knee joint: Secondary | ICD-10-CM | POA: Diagnosis not present

## 2016-11-09 DIAGNOSIS — M6281 Muscle weakness (generalized): Secondary | ICD-10-CM | POA: Diagnosis not present

## 2016-11-14 DIAGNOSIS — Z471 Aftercare following joint replacement surgery: Secondary | ICD-10-CM | POA: Diagnosis not present

## 2016-11-14 DIAGNOSIS — Z96651 Presence of right artificial knee joint: Secondary | ICD-10-CM | POA: Diagnosis not present

## 2016-11-14 DIAGNOSIS — R2689 Other abnormalities of gait and mobility: Secondary | ICD-10-CM | POA: Diagnosis not present

## 2016-11-14 DIAGNOSIS — M6281 Muscle weakness (generalized): Secondary | ICD-10-CM | POA: Diagnosis not present

## 2016-11-16 DIAGNOSIS — R2689 Other abnormalities of gait and mobility: Secondary | ICD-10-CM | POA: Diagnosis not present

## 2016-11-16 DIAGNOSIS — Z96651 Presence of right artificial knee joint: Secondary | ICD-10-CM | POA: Diagnosis not present

## 2016-11-16 DIAGNOSIS — Z471 Aftercare following joint replacement surgery: Secondary | ICD-10-CM | POA: Diagnosis not present

## 2016-11-16 DIAGNOSIS — M6281 Muscle weakness (generalized): Secondary | ICD-10-CM | POA: Diagnosis not present

## 2016-11-21 DIAGNOSIS — R2689 Other abnormalities of gait and mobility: Secondary | ICD-10-CM | POA: Diagnosis not present

## 2016-11-21 DIAGNOSIS — Z471 Aftercare following joint replacement surgery: Secondary | ICD-10-CM | POA: Diagnosis not present

## 2016-11-21 DIAGNOSIS — Z96651 Presence of right artificial knee joint: Secondary | ICD-10-CM | POA: Diagnosis not present

## 2016-11-21 DIAGNOSIS — M6281 Muscle weakness (generalized): Secondary | ICD-10-CM | POA: Diagnosis not present

## 2016-11-23 DIAGNOSIS — Z96651 Presence of right artificial knee joint: Secondary | ICD-10-CM | POA: Diagnosis not present

## 2016-11-23 DIAGNOSIS — Z471 Aftercare following joint replacement surgery: Secondary | ICD-10-CM | POA: Diagnosis not present

## 2016-11-23 DIAGNOSIS — M6281 Muscle weakness (generalized): Secondary | ICD-10-CM | POA: Diagnosis not present

## 2016-11-23 DIAGNOSIS — R2689 Other abnormalities of gait and mobility: Secondary | ICD-10-CM | POA: Diagnosis not present

## 2016-11-27 DIAGNOSIS — R2689 Other abnormalities of gait and mobility: Secondary | ICD-10-CM | POA: Diagnosis not present

## 2016-11-27 DIAGNOSIS — M6281 Muscle weakness (generalized): Secondary | ICD-10-CM | POA: Diagnosis not present

## 2016-11-27 DIAGNOSIS — Z471 Aftercare following joint replacement surgery: Secondary | ICD-10-CM | POA: Diagnosis not present

## 2016-11-27 DIAGNOSIS — Z96651 Presence of right artificial knee joint: Secondary | ICD-10-CM | POA: Diagnosis not present

## 2016-11-29 DIAGNOSIS — R2689 Other abnormalities of gait and mobility: Secondary | ICD-10-CM | POA: Diagnosis not present

## 2016-11-29 DIAGNOSIS — Z96651 Presence of right artificial knee joint: Secondary | ICD-10-CM | POA: Diagnosis not present

## 2016-11-29 DIAGNOSIS — Z471 Aftercare following joint replacement surgery: Secondary | ICD-10-CM | POA: Diagnosis not present

## 2016-11-29 DIAGNOSIS — M6281 Muscle weakness (generalized): Secondary | ICD-10-CM | POA: Diagnosis not present

## 2016-11-30 DIAGNOSIS — Z96651 Presence of right artificial knee joint: Secondary | ICD-10-CM | POA: Diagnosis not present

## 2016-11-30 DIAGNOSIS — T814XXA Infection following a procedure, initial encounter: Secondary | ICD-10-CM | POA: Diagnosis not present

## 2016-12-04 DIAGNOSIS — Z96651 Presence of right artificial knee joint: Secondary | ICD-10-CM | POA: Diagnosis not present

## 2016-12-04 DIAGNOSIS — Z471 Aftercare following joint replacement surgery: Secondary | ICD-10-CM | POA: Diagnosis not present

## 2016-12-04 DIAGNOSIS — R2689 Other abnormalities of gait and mobility: Secondary | ICD-10-CM | POA: Diagnosis not present

## 2016-12-04 DIAGNOSIS — M6281 Muscle weakness (generalized): Secondary | ICD-10-CM | POA: Diagnosis not present

## 2016-12-07 DIAGNOSIS — M6281 Muscle weakness (generalized): Secondary | ICD-10-CM | POA: Diagnosis not present

## 2016-12-07 DIAGNOSIS — Z471 Aftercare following joint replacement surgery: Secondary | ICD-10-CM | POA: Diagnosis not present

## 2016-12-07 DIAGNOSIS — Z96651 Presence of right artificial knee joint: Secondary | ICD-10-CM | POA: Diagnosis not present

## 2016-12-07 DIAGNOSIS — R2689 Other abnormalities of gait and mobility: Secondary | ICD-10-CM | POA: Diagnosis not present

## 2016-12-11 DIAGNOSIS — Z96651 Presence of right artificial knee joint: Secondary | ICD-10-CM | POA: Diagnosis not present

## 2016-12-11 DIAGNOSIS — R2689 Other abnormalities of gait and mobility: Secondary | ICD-10-CM | POA: Diagnosis not present

## 2016-12-11 DIAGNOSIS — Z471 Aftercare following joint replacement surgery: Secondary | ICD-10-CM | POA: Diagnosis not present

## 2016-12-11 DIAGNOSIS — M6281 Muscle weakness (generalized): Secondary | ICD-10-CM | POA: Diagnosis not present

## 2016-12-13 DIAGNOSIS — H524 Presbyopia: Secondary | ICD-10-CM | POA: Diagnosis not present

## 2016-12-13 DIAGNOSIS — H52222 Regular astigmatism, left eye: Secondary | ICD-10-CM | POA: Diagnosis not present

## 2016-12-13 DIAGNOSIS — H5212 Myopia, left eye: Secondary | ICD-10-CM | POA: Diagnosis not present

## 2016-12-14 DIAGNOSIS — M6281 Muscle weakness (generalized): Secondary | ICD-10-CM | POA: Diagnosis not present

## 2016-12-14 DIAGNOSIS — Z96651 Presence of right artificial knee joint: Secondary | ICD-10-CM | POA: Diagnosis not present

## 2016-12-14 DIAGNOSIS — R2689 Other abnormalities of gait and mobility: Secondary | ICD-10-CM | POA: Diagnosis not present

## 2016-12-14 DIAGNOSIS — Z471 Aftercare following joint replacement surgery: Secondary | ICD-10-CM | POA: Diagnosis not present

## 2016-12-19 DIAGNOSIS — R2689 Other abnormalities of gait and mobility: Secondary | ICD-10-CM | POA: Diagnosis not present

## 2016-12-19 DIAGNOSIS — M6281 Muscle weakness (generalized): Secondary | ICD-10-CM | POA: Diagnosis not present

## 2016-12-19 DIAGNOSIS — Z471 Aftercare following joint replacement surgery: Secondary | ICD-10-CM | POA: Diagnosis not present

## 2016-12-19 DIAGNOSIS — Z96651 Presence of right artificial knee joint: Secondary | ICD-10-CM | POA: Diagnosis not present

## 2016-12-22 DIAGNOSIS — Z96651 Presence of right artificial knee joint: Secondary | ICD-10-CM | POA: Diagnosis not present

## 2016-12-22 DIAGNOSIS — Z471 Aftercare following joint replacement surgery: Secondary | ICD-10-CM | POA: Diagnosis not present

## 2016-12-22 DIAGNOSIS — M6281 Muscle weakness (generalized): Secondary | ICD-10-CM | POA: Diagnosis not present

## 2016-12-22 DIAGNOSIS — R2689 Other abnormalities of gait and mobility: Secondary | ICD-10-CM | POA: Diagnosis not present

## 2016-12-27 DIAGNOSIS — Z96651 Presence of right artificial knee joint: Secondary | ICD-10-CM | POA: Diagnosis not present

## 2016-12-27 DIAGNOSIS — R2689 Other abnormalities of gait and mobility: Secondary | ICD-10-CM | POA: Diagnosis not present

## 2016-12-27 DIAGNOSIS — Z471 Aftercare following joint replacement surgery: Secondary | ICD-10-CM | POA: Diagnosis not present

## 2016-12-27 DIAGNOSIS — M6281 Muscle weakness (generalized): Secondary | ICD-10-CM | POA: Diagnosis not present

## 2016-12-29 DIAGNOSIS — R2689 Other abnormalities of gait and mobility: Secondary | ICD-10-CM | POA: Diagnosis not present

## 2016-12-29 DIAGNOSIS — Z471 Aftercare following joint replacement surgery: Secondary | ICD-10-CM | POA: Diagnosis not present

## 2016-12-29 DIAGNOSIS — M6281 Muscle weakness (generalized): Secondary | ICD-10-CM | POA: Diagnosis not present

## 2016-12-29 DIAGNOSIS — Z96651 Presence of right artificial knee joint: Secondary | ICD-10-CM | POA: Diagnosis not present

## 2017-01-01 DIAGNOSIS — Z471 Aftercare following joint replacement surgery: Secondary | ICD-10-CM | POA: Diagnosis not present

## 2017-01-01 DIAGNOSIS — Z96651 Presence of right artificial knee joint: Secondary | ICD-10-CM | POA: Diagnosis not present

## 2017-01-01 DIAGNOSIS — R2689 Other abnormalities of gait and mobility: Secondary | ICD-10-CM | POA: Diagnosis not present

## 2017-01-01 DIAGNOSIS — M6281 Muscle weakness (generalized): Secondary | ICD-10-CM | POA: Diagnosis not present

## 2017-01-04 DIAGNOSIS — Z471 Aftercare following joint replacement surgery: Secondary | ICD-10-CM | POA: Diagnosis not present

## 2017-01-04 DIAGNOSIS — R2689 Other abnormalities of gait and mobility: Secondary | ICD-10-CM | POA: Diagnosis not present

## 2017-01-04 DIAGNOSIS — M6281 Muscle weakness (generalized): Secondary | ICD-10-CM | POA: Diagnosis not present

## 2017-01-04 DIAGNOSIS — Z96651 Presence of right artificial knee joint: Secondary | ICD-10-CM | POA: Diagnosis not present

## 2017-01-09 DIAGNOSIS — A084 Viral intestinal infection, unspecified: Secondary | ICD-10-CM | POA: Diagnosis not present

## 2017-01-09 DIAGNOSIS — Z79899 Other long term (current) drug therapy: Secondary | ICD-10-CM | POA: Diagnosis not present

## 2017-01-09 DIAGNOSIS — E042 Nontoxic multinodular goiter: Secondary | ICD-10-CM | POA: Diagnosis not present

## 2017-01-09 DIAGNOSIS — E785 Hyperlipidemia, unspecified: Secondary | ICD-10-CM | POA: Diagnosis not present

## 2017-01-09 DIAGNOSIS — R51 Headache: Secondary | ICD-10-CM | POA: Diagnosis not present

## 2017-01-09 DIAGNOSIS — I10 Essential (primary) hypertension: Secondary | ICD-10-CM | POA: Diagnosis not present

## 2017-01-16 DIAGNOSIS — M6281 Muscle weakness (generalized): Secondary | ICD-10-CM | POA: Diagnosis not present

## 2017-01-16 DIAGNOSIS — Z471 Aftercare following joint replacement surgery: Secondary | ICD-10-CM | POA: Diagnosis not present

## 2017-01-16 DIAGNOSIS — Z96651 Presence of right artificial knee joint: Secondary | ICD-10-CM | POA: Diagnosis not present

## 2017-01-16 DIAGNOSIS — R2689 Other abnormalities of gait and mobility: Secondary | ICD-10-CM | POA: Diagnosis not present

## 2017-01-19 DIAGNOSIS — M6281 Muscle weakness (generalized): Secondary | ICD-10-CM | POA: Diagnosis not present

## 2017-01-19 DIAGNOSIS — Z96651 Presence of right artificial knee joint: Secondary | ICD-10-CM | POA: Diagnosis not present

## 2017-01-19 DIAGNOSIS — R2689 Other abnormalities of gait and mobility: Secondary | ICD-10-CM | POA: Diagnosis not present

## 2017-01-19 DIAGNOSIS — Z471 Aftercare following joint replacement surgery: Secondary | ICD-10-CM | POA: Diagnosis not present

## 2017-02-05 DIAGNOSIS — M24571 Contracture, right ankle: Secondary | ICD-10-CM | POA: Diagnosis not present

## 2017-02-05 DIAGNOSIS — M722 Plantar fascial fibromatosis: Secondary | ICD-10-CM | POA: Diagnosis not present

## 2017-03-20 DIAGNOSIS — Z86010 Personal history of colonic polyps: Secondary | ICD-10-CM | POA: Diagnosis not present

## 2017-04-09 DIAGNOSIS — Z0000 Encounter for general adult medical examination without abnormal findings: Secondary | ICD-10-CM | POA: Diagnosis not present

## 2017-04-12 DIAGNOSIS — E042 Nontoxic multinodular goiter: Secondary | ICD-10-CM | POA: Diagnosis not present

## 2017-04-12 DIAGNOSIS — I10 Essential (primary) hypertension: Secondary | ICD-10-CM | POA: Diagnosis not present

## 2017-04-12 DIAGNOSIS — R1310 Dysphagia, unspecified: Secondary | ICD-10-CM | POA: Diagnosis not present

## 2017-04-12 DIAGNOSIS — K219 Gastro-esophageal reflux disease without esophagitis: Secondary | ICD-10-CM | POA: Diagnosis not present

## 2017-04-23 DIAGNOSIS — Z96651 Presence of right artificial knee joint: Secondary | ICD-10-CM | POA: Diagnosis not present

## 2017-05-29 ENCOUNTER — Other Ambulatory Visit: Payer: Self-pay | Admitting: Surgery

## 2017-05-29 DIAGNOSIS — Z01419 Encounter for gynecological examination (general) (routine) without abnormal findings: Secondary | ICD-10-CM | POA: Diagnosis not present

## 2017-05-29 DIAGNOSIS — Z6825 Body mass index (BMI) 25.0-25.9, adult: Secondary | ICD-10-CM | POA: Diagnosis not present

## 2017-05-29 DIAGNOSIS — Z1231 Encounter for screening mammogram for malignant neoplasm of breast: Secondary | ICD-10-CM | POA: Diagnosis not present

## 2017-05-29 DIAGNOSIS — E042 Nontoxic multinodular goiter: Secondary | ICD-10-CM

## 2017-06-07 ENCOUNTER — Ambulatory Visit
Admission: RE | Admit: 2017-06-07 | Discharge: 2017-06-07 | Disposition: A | Discharge status: Home / Self Care | Payer: PPO | Source: Ambulatory Visit | Attending: Surgery | Admitting: Surgery

## 2017-06-07 DIAGNOSIS — E042 Nontoxic multinodular goiter: Secondary | ICD-10-CM | POA: Diagnosis not present

## 2017-07-03 DIAGNOSIS — E042 Nontoxic multinodular goiter: Secondary | ICD-10-CM | POA: Diagnosis not present

## 2017-07-03 DIAGNOSIS — E039 Hypothyroidism, unspecified: Secondary | ICD-10-CM | POA: Diagnosis not present

## 2017-10-18 DIAGNOSIS — Z79899 Other long term (current) drug therapy: Secondary | ICD-10-CM | POA: Diagnosis not present

## 2017-10-18 DIAGNOSIS — E042 Nontoxic multinodular goiter: Secondary | ICD-10-CM | POA: Diagnosis not present

## 2017-10-18 DIAGNOSIS — Z23 Encounter for immunization: Secondary | ICD-10-CM | POA: Diagnosis not present

## 2017-10-18 DIAGNOSIS — D708 Other neutropenia: Secondary | ICD-10-CM | POA: Diagnosis not present

## 2017-10-18 DIAGNOSIS — I10 Essential (primary) hypertension: Secondary | ICD-10-CM | POA: Diagnosis not present

## 2017-10-18 DIAGNOSIS — E785 Hyperlipidemia, unspecified: Secondary | ICD-10-CM | POA: Diagnosis not present

## 2017-11-12 DIAGNOSIS — M1711 Unilateral primary osteoarthritis, right knee: Secondary | ICD-10-CM | POA: Diagnosis not present

## 2017-11-12 DIAGNOSIS — Z96651 Presence of right artificial knee joint: Secondary | ICD-10-CM | POA: Diagnosis not present

## 2017-11-26 DIAGNOSIS — G5602 Carpal tunnel syndrome, left upper limb: Secondary | ICD-10-CM | POA: Diagnosis not present

## 2017-11-26 DIAGNOSIS — M79642 Pain in left hand: Secondary | ICD-10-CM | POA: Diagnosis not present

## 2017-12-12 IMAGING — US US THYROID
1 series · 12 of 25 positions shown · non-contrast
Comparison: 01/12/2015 and previous

CLINICAL DATA: Goiter, nodules. Previous biopsy of dominant
bilateral lesions 01/09/2013.

EXAM:
THYROID ULTRASOUND
TECHNIQUE: Ultrasound examination of the thyroid gland and adjacent soft
tissues was performed.

[Series 1: us thyroid · 0.08mm/px · 12 of 70 slices shown]
[im 3/70]
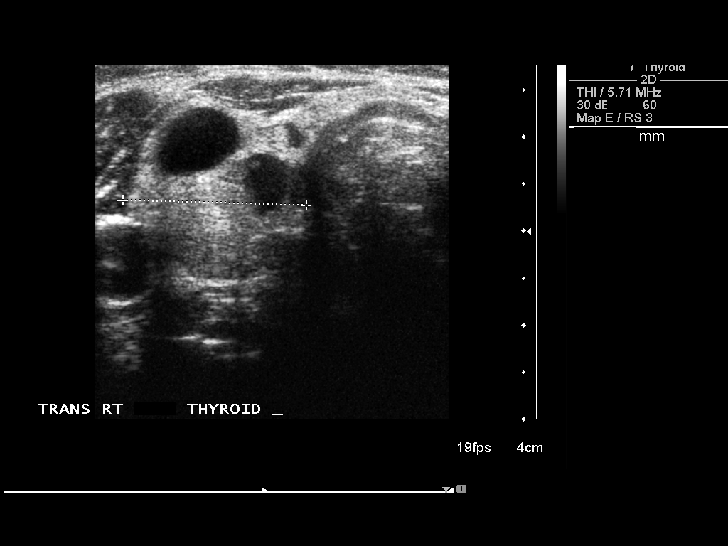
[im 9/70]
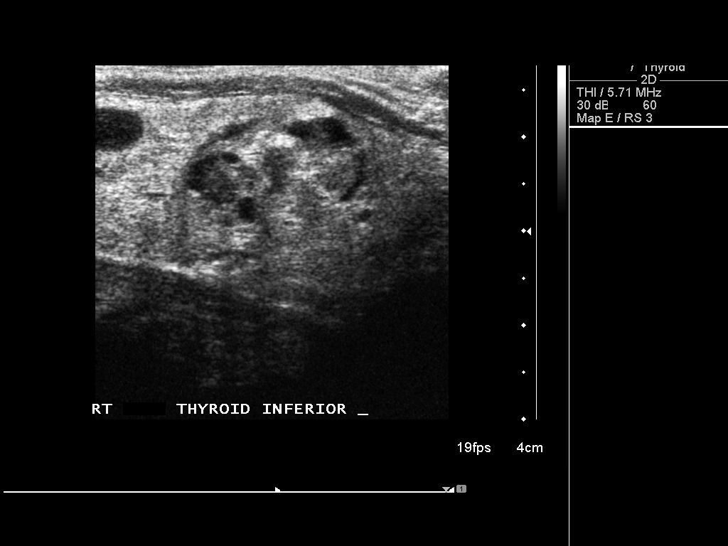
[im 15/70]
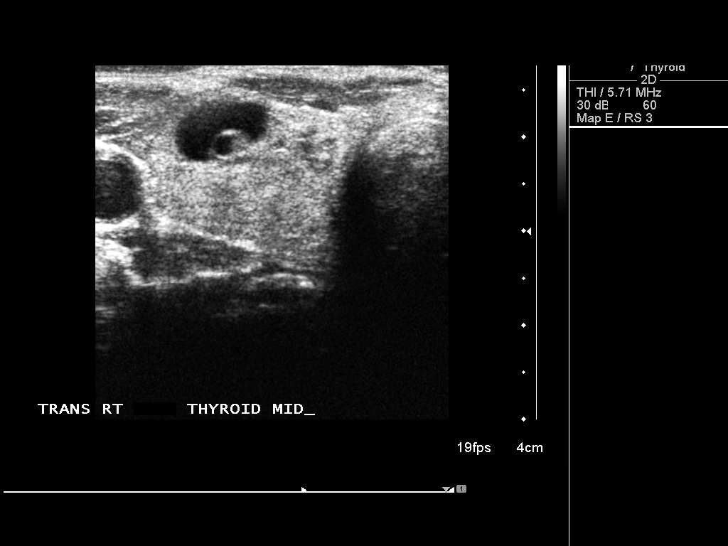
[im 21/70]
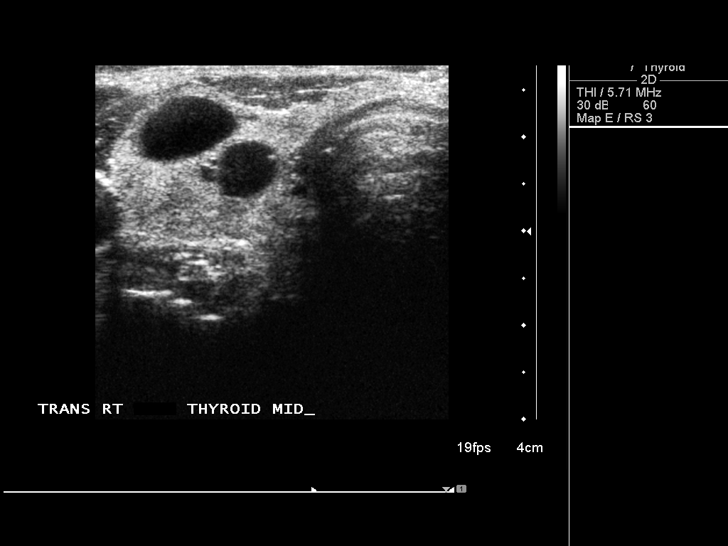
[im 26/70]
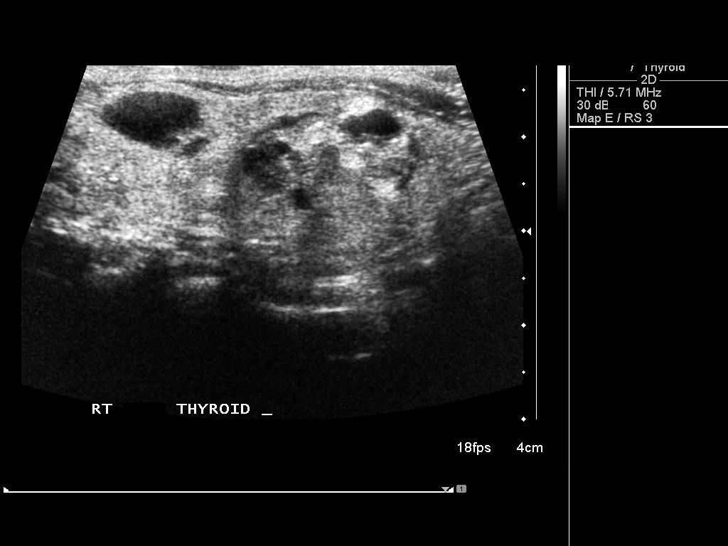
[im 32/70]
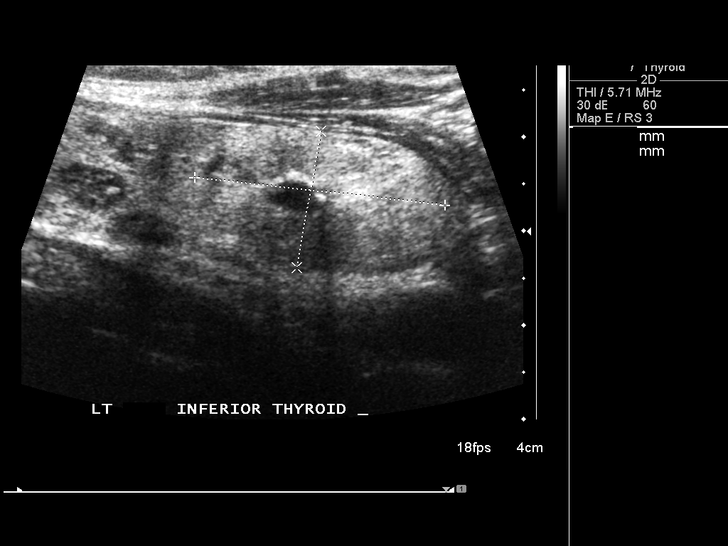
[im 38/70]
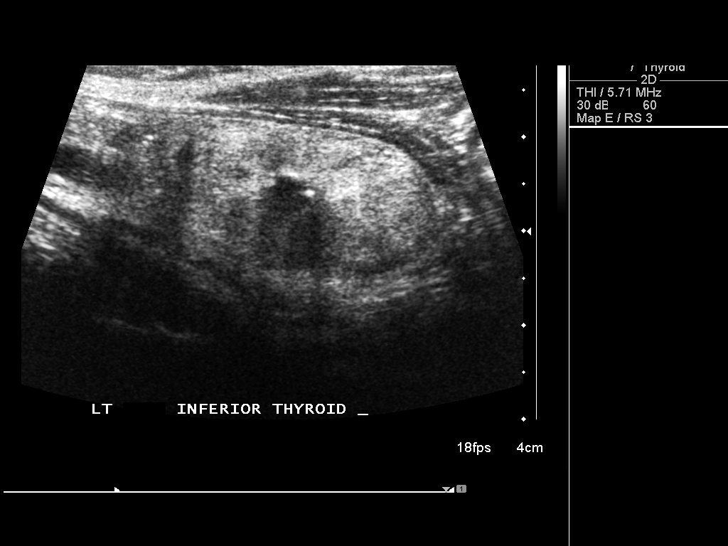
[im 44/70]
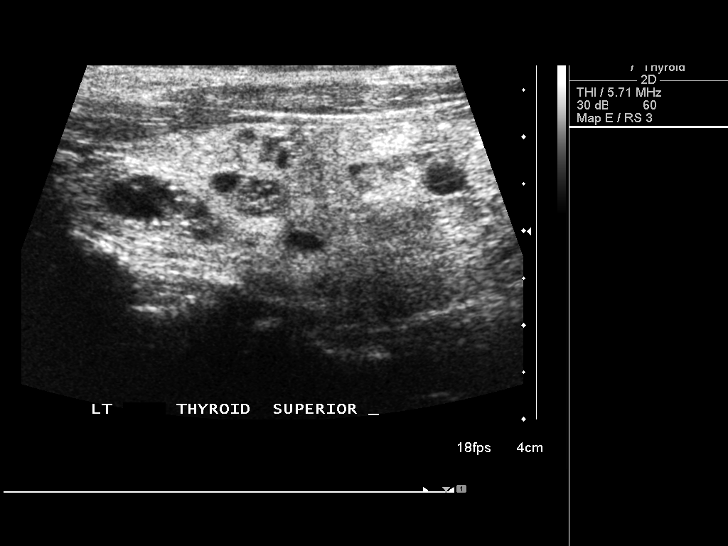
[im 49/70]
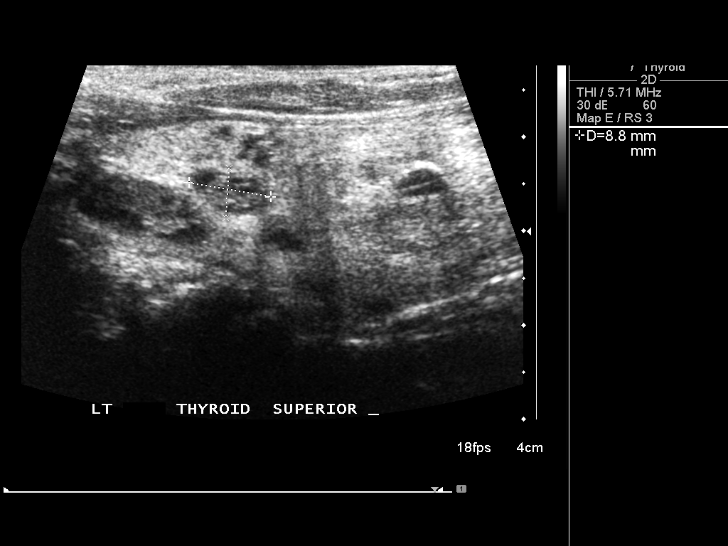
[im 55/70]
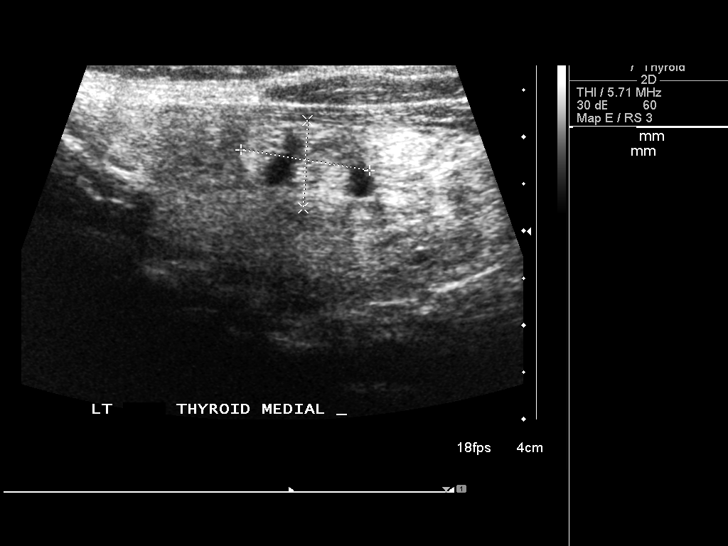
[im 61/70]
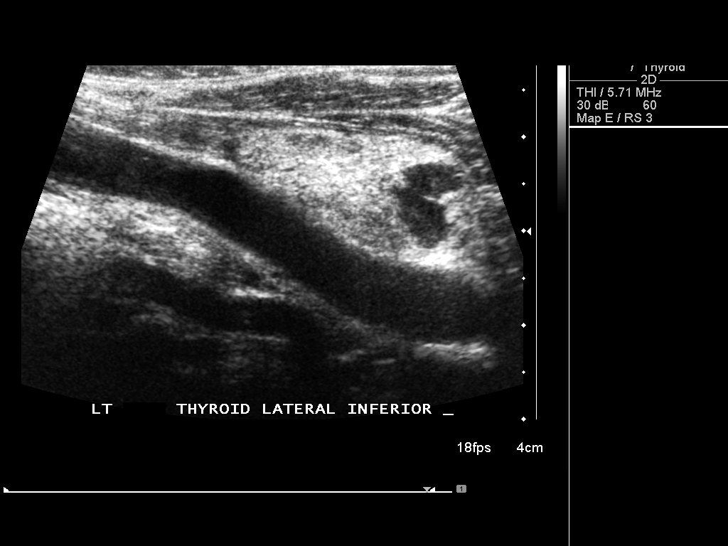
[im 67/70]
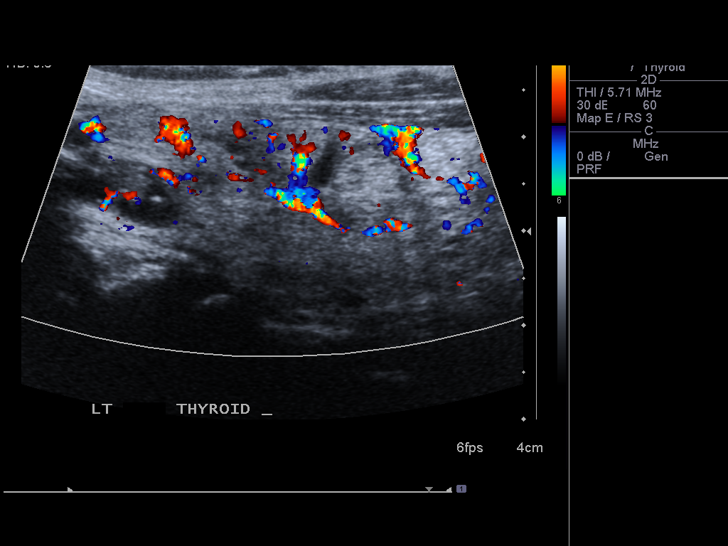

[12 of 25 positions shown; findings below may reference images not displayed]

FINDINGS: Parenchymal Echotexture: Mildly heterogenous

Estimated total number of nodules > 1 cm: <5

Number of spongiform nodules > 2 cm not described below (TR1): 0

Number of mixed cystic nodules > 1.5 cm not described below (TR2): 0

_________________________________________________________

Isthmus: 2.5 mm

No discrete nodules are identified within the thyroid isthmus.

_________________________________________________________

Right lobe: 6 x 2.5 x 2 cm

Nodule # 1:

Location: Right; Inferior

Size: 2.1 x 1.4 x 1.8 cm (previously 2 x 1.6 x 1.6)

Composition: solid/almost completely solid (2)

Echogenicity: isoechoic (1)

Shape: not taller-than-wide (0)

Margins: smooth (0)

Echogenic foci: none (0)

ACR TI-RADS total points: 3.

ACR TI-RADS risk category: TR3 (3 points).

ACR TI-RADS recommendations:

Previously biopsied. Assuming a benign diagnosis, repeat biopsy or
dedicated follow-up is NOT recommended based on TI-RADS criteria.

Nodule # 2:

Location: Right; Mid

Size: 1.3 x 0.8 x 1 cm

Composition: mixed cystic and solid (1)

Echogenicity: anechoic (0)

Shape: not taller-than-wide (0)

Margins: smooth (0)

Echogenic foci: none (0)

ACR TI-RADS total points: 1.

ACR TI-RADS risk category: TR2 (2 points).

ACR TI-RADS recommendations:

This nodule does NOT meet TI-RADS criteria for biopsy or dedicated
follow-up.

At least 2 additional sub cm cystic lesions.

_________________________________________________________

Left lobe: 6 x 1.9 x 2.1 cm

Nodule # 1:

Location: Left; Inferior

Size: 2.7 x 1.5 x 1.9 cm (previously 2.9 x 1.4 x 1.6)

Composition: solid/almost completely solid (2)

Echogenicity: isoechoic (1)

Shape: not taller-than-wide (0)

Margins: ill-defined (0)

Echogenic foci: macrocalcifications (1)

ACR TI-RADS total points: 4.

ACR TI-RADS risk category: TR4 (4-6 points).

ACR TI-RADS recommendations:

Previously biopsied. Assuming a benign diagnosis, repeat biopsy or
dedicated follow-up is NOT recommended based on TI-RADS criteria.

Nodule # 2:

Location: Left; Superior

Size: 1.6 x 0.8 x 0.9 cm

Composition: mixed cystic and solid (1)

Echogenicity: hypoechoic (2)

Shape: not taller-than-wide (0)

Margins: smooth (0)

Echogenic foci: punctate echogenic foci (3)

ACR TI-RADS total points: 6.

ACR TI-RADS risk category: TR4 (4-6 points).

ACR TI-RADS recommendations:

**Given size (>1.5 cm) and appearance, fine needle aspiration of
this moderately suspicious nodule should be considered based on
TI-RADS criteria.

Nodule # 3:

Location: Left; Superior

Size: 0.9 x 0.5 x 0.7 cm

Composition: cannot determine (2)

Echogenicity: hypoechoic (2)

Shape: not taller-than-wide (0)

Margins: smooth (0)

Echogenic foci: punctate echogenic foci (3)

ACR TI-RADS total points: 7.

ACR TI-RADS risk category: TR5 (?7 points).

ACR TI-RADS recommendations:

*Given size (0.5 - 0.9 cm) and appearance, a follow-up ultrasound in
1 year is recommended based on TI-RADS criteria as clinically
indicated.

Nodule # 4:

Location: Left; Mid

Size: 1.4 x 0.9 x 1.2 cm

Composition: solid/almost completely solid (2)

Echogenicity: hyperechoic (1)

Shape: not taller-than-wide (0)

Margins: ill-defined (0)

Echogenic foci: none (0)

ACR TI-RADS total points: 3.

ACR TI-RADS risk category: TR3 (3 points).

ACR TI-RADS recommendations:

Given size (<1.5 cm) and appearance, this nodule does NOT meet
TI-RADS criteria for biopsy or dedicated follow-up.
IMPRESSION: 1. Thyromegaly with little change in largest nodules.
2. 16 mm superior left nodule is moderately suspicious, and FNA
biopsy should be considered.
3. 1 year follow-up of 9 mm superior left in 14 mm mid left nodules
recommended.

The above is in keeping with the ACR TI-RADS recommendations - [HOSPITAL] 8675;[DATE].

## 2017-12-25 DIAGNOSIS — B349 Viral infection, unspecified: Secondary | ICD-10-CM | POA: Diagnosis not present

## 2018-01-01 DIAGNOSIS — H52222 Regular astigmatism, left eye: Secondary | ICD-10-CM | POA: Diagnosis not present

## 2018-01-01 DIAGNOSIS — H5212 Myopia, left eye: Secondary | ICD-10-CM | POA: Diagnosis not present

## 2018-01-01 DIAGNOSIS — H524 Presbyopia: Secondary | ICD-10-CM | POA: Diagnosis not present

## 2018-02-04 DIAGNOSIS — G5602 Carpal tunnel syndrome, left upper limb: Secondary | ICD-10-CM | POA: Diagnosis not present

## 2018-02-04 DIAGNOSIS — M79642 Pain in left hand: Secondary | ICD-10-CM | POA: Diagnosis not present

## 2018-02-05 DIAGNOSIS — G5602 Carpal tunnel syndrome, left upper limb: Secondary | ICD-10-CM | POA: Diagnosis not present

## 2018-02-18 DIAGNOSIS — M79642 Pain in left hand: Secondary | ICD-10-CM | POA: Diagnosis not present

## 2018-04-12 DIAGNOSIS — Z0000 Encounter for general adult medical examination without abnormal findings: Secondary | ICD-10-CM | POA: Diagnosis not present

## 2018-06-14 DIAGNOSIS — I10 Essential (primary) hypertension: Secondary | ICD-10-CM | POA: Diagnosis not present

## 2018-06-14 DIAGNOSIS — E785 Hyperlipidemia, unspecified: Secondary | ICD-10-CM | POA: Diagnosis not present

## 2018-06-14 DIAGNOSIS — Z79899 Other long term (current) drug therapy: Secondary | ICD-10-CM | POA: Diagnosis not present

## 2018-06-14 DIAGNOSIS — E042 Nontoxic multinodular goiter: Secondary | ICD-10-CM | POA: Diagnosis not present

## 2018-06-14 DIAGNOSIS — R072 Precordial pain: Secondary | ICD-10-CM | POA: Diagnosis not present

## 2018-06-14 DIAGNOSIS — R079 Chest pain, unspecified: Secondary | ICD-10-CM | POA: Diagnosis not present

## 2018-06-18 DIAGNOSIS — Z1231 Encounter for screening mammogram for malignant neoplasm of breast: Secondary | ICD-10-CM | POA: Diagnosis not present

## 2018-06-18 DIAGNOSIS — Z6825 Body mass index (BMI) 25.0-25.9, adult: Secondary | ICD-10-CM | POA: Diagnosis not present

## 2018-06-18 DIAGNOSIS — Z01419 Encounter for gynecological examination (general) (routine) without abnormal findings: Secondary | ICD-10-CM | POA: Diagnosis not present

## 2018-07-11 ENCOUNTER — Other Ambulatory Visit: Payer: Self-pay | Admitting: Surgery

## 2018-07-11 DIAGNOSIS — E039 Hypothyroidism, unspecified: Secondary | ICD-10-CM

## 2018-07-11 DIAGNOSIS — E042 Nontoxic multinodular goiter: Secondary | ICD-10-CM

## 2018-07-22 DIAGNOSIS — E785 Hyperlipidemia, unspecified: Secondary | ICD-10-CM | POA: Diagnosis not present

## 2018-07-22 DIAGNOSIS — I10 Essential (primary) hypertension: Secondary | ICD-10-CM | POA: Diagnosis not present

## 2018-07-22 DIAGNOSIS — R079 Chest pain, unspecified: Secondary | ICD-10-CM | POA: Diagnosis not present

## 2018-07-25 DIAGNOSIS — R079 Chest pain, unspecified: Secondary | ICD-10-CM | POA: Diagnosis not present

## 2018-09-11 DIAGNOSIS — E039 Hypothyroidism, unspecified: Secondary | ICD-10-CM | POA: Diagnosis not present

## 2018-09-11 DIAGNOSIS — E042 Nontoxic multinodular goiter: Secondary | ICD-10-CM | POA: Diagnosis not present

## 2018-09-24 ENCOUNTER — Other Ambulatory Visit: Payer: PPO

## 2018-10-14 ENCOUNTER — Ambulatory Visit
Admission: RE | Admit: 2018-10-14 | Discharge: 2018-10-14 | Disposition: A | Discharge status: Home / Self Care | Payer: PPO | Source: Ambulatory Visit | Attending: Surgery | Admitting: Surgery

## 2018-10-14 DIAGNOSIS — E039 Hypothyroidism, unspecified: Secondary | ICD-10-CM

## 2018-10-14 DIAGNOSIS — E042 Nontoxic multinodular goiter: Secondary | ICD-10-CM | POA: Diagnosis not present

## 2018-11-06 DIAGNOSIS — H35033 Hypertensive retinopathy, bilateral: Secondary | ICD-10-CM | POA: Diagnosis not present

## 2018-11-06 DIAGNOSIS — H43813 Vitreous degeneration, bilateral: Secondary | ICD-10-CM | POA: Diagnosis not present

## 2018-11-06 DIAGNOSIS — Z961 Presence of intraocular lens: Secondary | ICD-10-CM | POA: Diagnosis not present

## 2018-11-06 DIAGNOSIS — H26493 Other secondary cataract, bilateral: Secondary | ICD-10-CM | POA: Diagnosis not present

## 2018-11-06 DIAGNOSIS — H35041 Retinal micro-aneurysms, unspecified, right eye: Secondary | ICD-10-CM | POA: Diagnosis not present

## 2018-11-08 DIAGNOSIS — F411 Generalized anxiety disorder: Secondary | ICD-10-CM | POA: Diagnosis not present

## 2018-11-08 DIAGNOSIS — Z636 Dependent relative needing care at home: Secondary | ICD-10-CM | POA: Diagnosis not present

## 2018-11-08 DIAGNOSIS — F321 Major depressive disorder, single episode, moderate: Secondary | ICD-10-CM | POA: Diagnosis not present

## 2019-03-05 IMAGING — US US THYROID
1 series · 12 of 25 positions shown · non-contrast
Comparison: None.

CLINICAL DATA: Multiple thyroid nodules. Left superior thyroid
nodule was biopsied on 07/04/2016 and biopsy was consistent with a
benign follicular nodule. Bilateral dominant nodules were biopsied
on 01/09/2013 and these biopsies were benign.

EXAM:
THYROID ULTRASOUND
TECHNIQUE: Ultrasound examination of the thyroid gland and adjacent soft
tissues was performed.

[Series 1: us thyroid · 0.06mm/px · 12 of 75 slices shown]
[im 4/75]
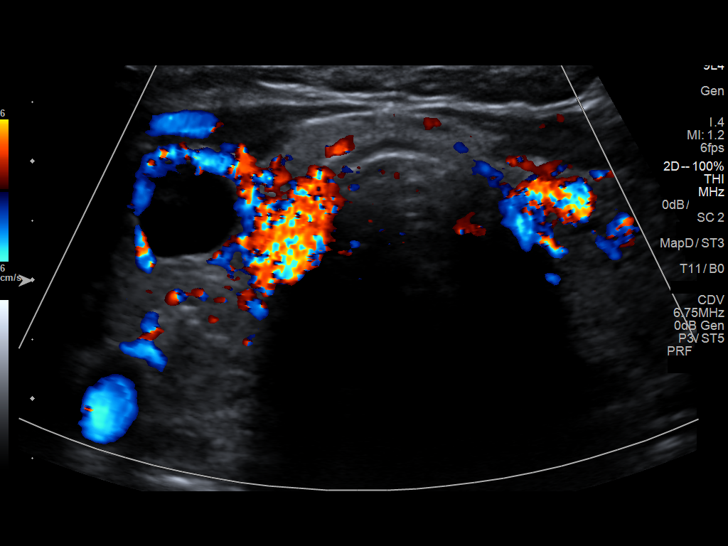
[im 10/75]
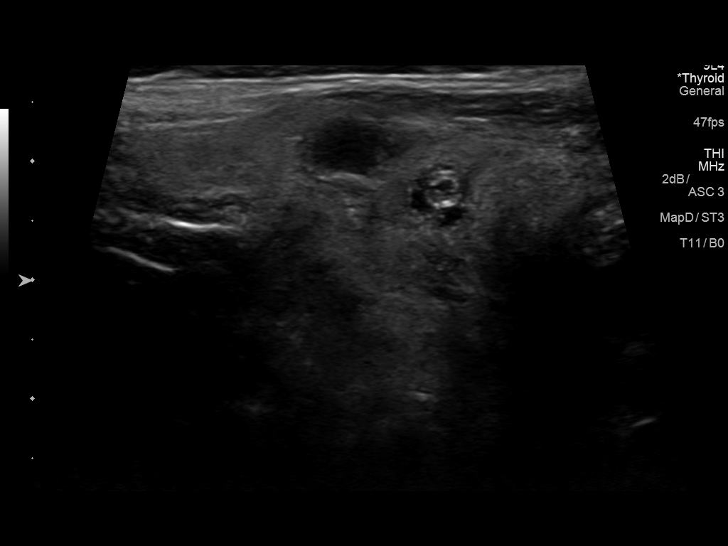
[im 16/75]
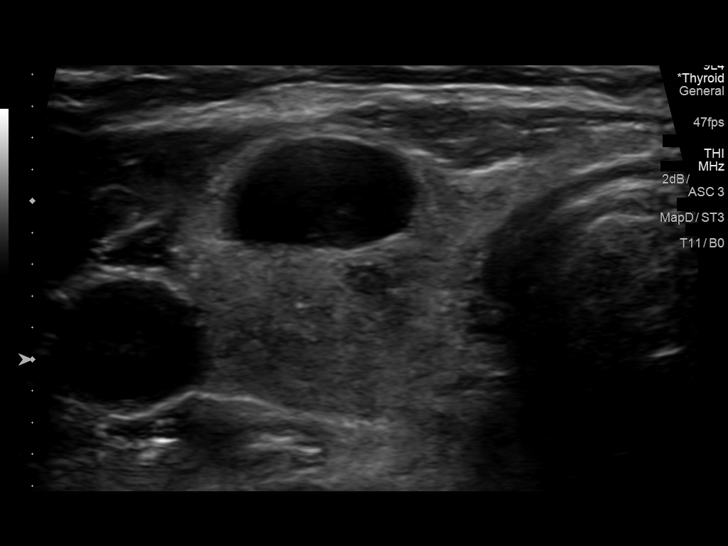
[im 22/75]
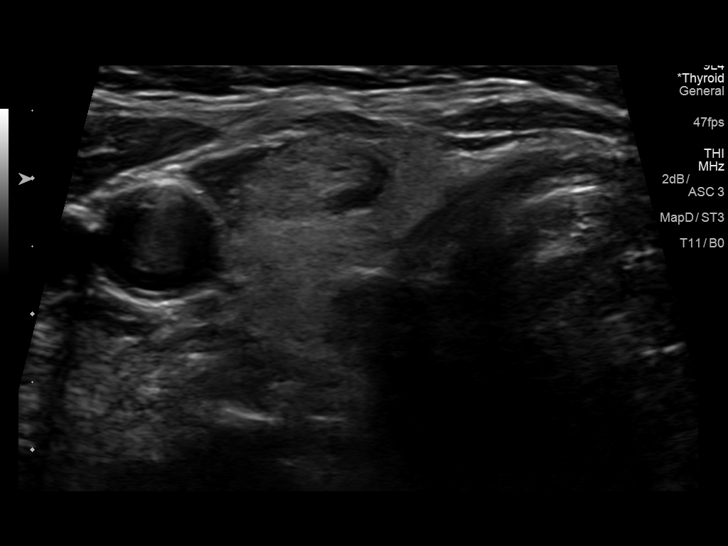
[im 28/75]
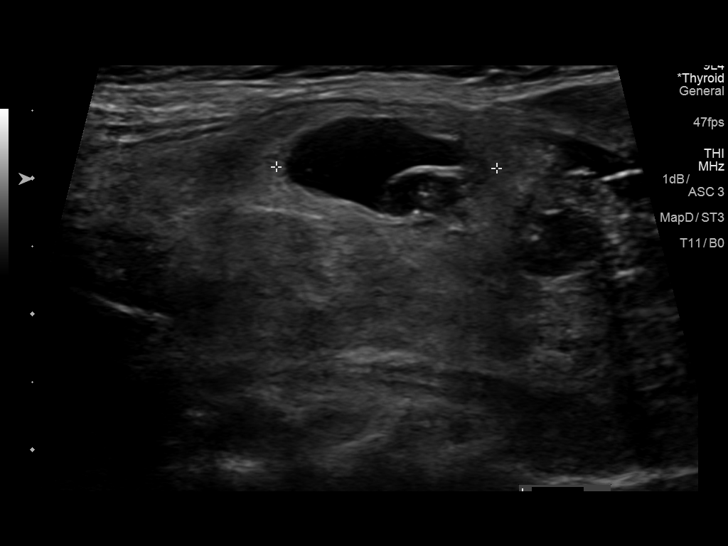
[im 34/75]
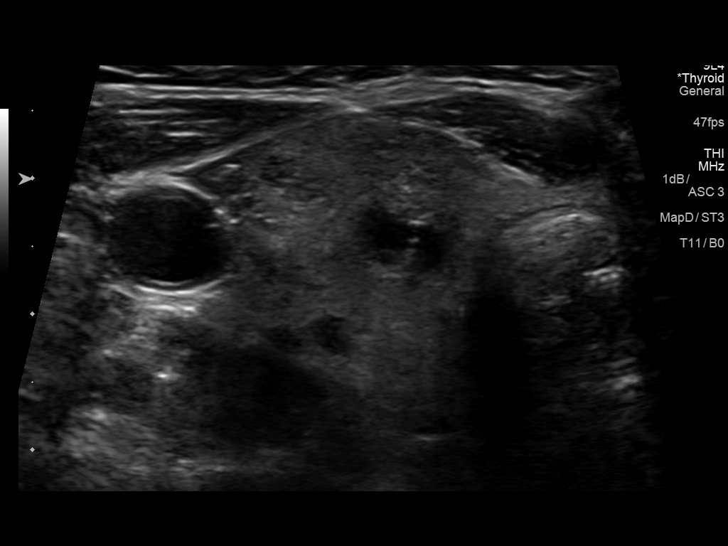
[im 41/75]
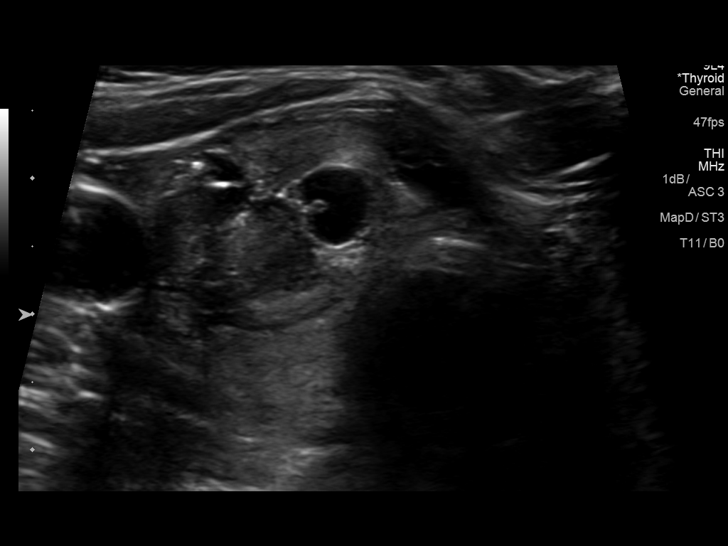
[im 47/75]
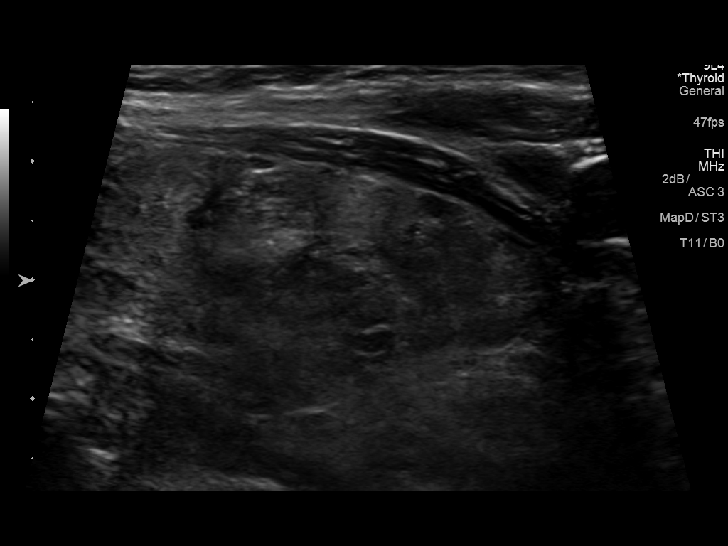
[im 53/75]
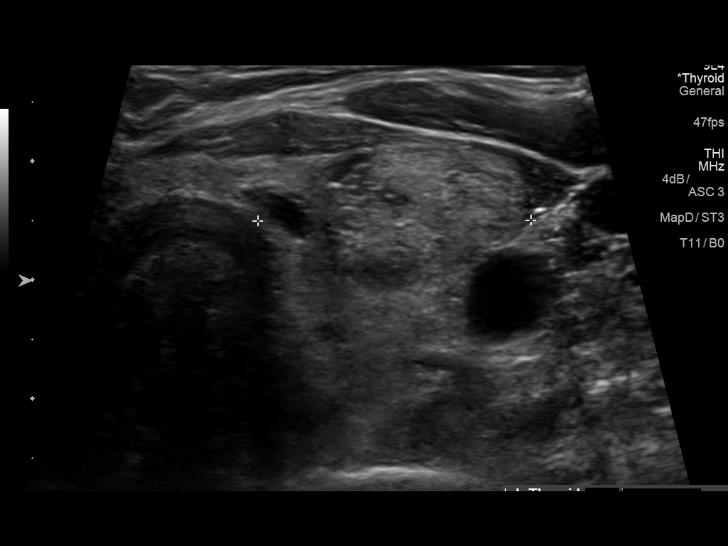
[im 59/75]
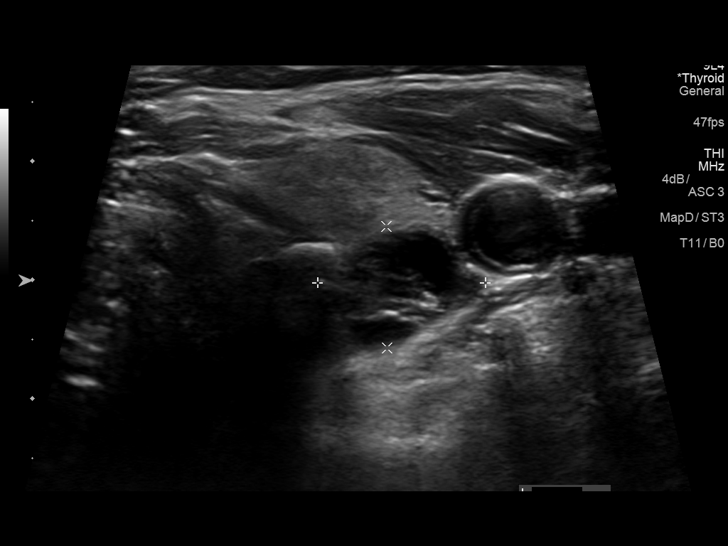
[im 65/75]
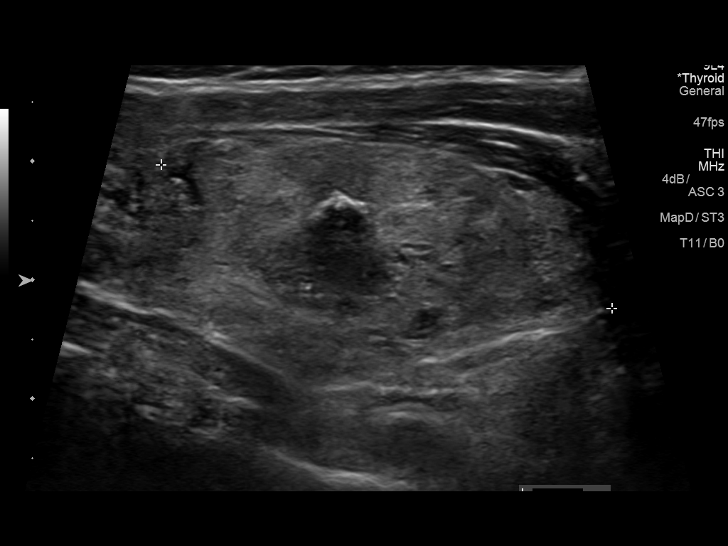
[im 71/75]
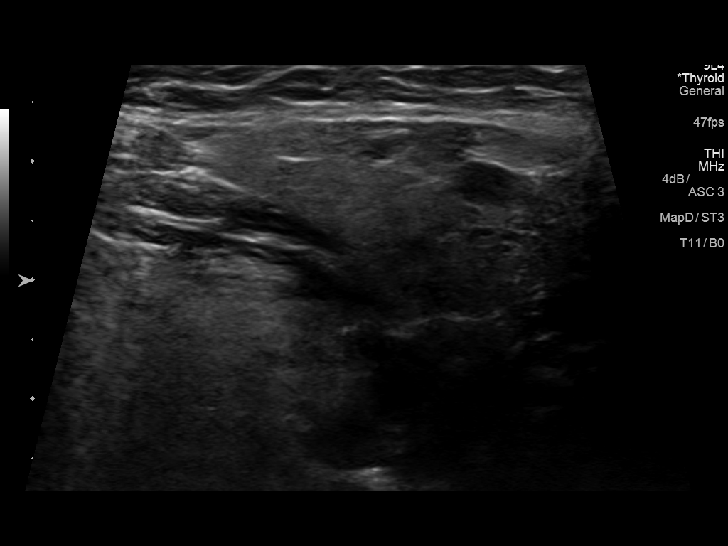

[12 of 25 positions shown; findings below may reference images not displayed]

FINDINGS: Parenchymal Echotexture: Moderately heterogenous

Isthmus: 0.3 cm, previously 0.3 cm.

Right lobe: 6.1 x 2.4 x 1.9 cm, previously 6.0 x 2.5 x 2.0 cm

Left lobe: 5.9 x 2.3 x 2.3 cm, previously 6.0 x 1.9 x 2.1 cm

_________________________________________________________

Estimated total number of nodules >/= 1 cm: 6-10

Number of spongiform nodules >/=  2 cm not described below (TR1): 0

Number of mixed cystic and solid nodules >/= 1.5 cm not described
below (TR2): 0

_________________________________________________________

Nodule # 1:

Location: Right; Superior

Maximum size: 1.6 cm; Other 2 dimensions: 0.6 x 1.1 cm

Composition: solid/almost completely solid (2)

Echogenicity: isoechoic (1)

Shape: not taller-than-wide (0)

Margins: ill-defined (0)

Echogenic foci: none (0)

ACR TI-RADS total points: 3.

ACR TI-RADS risk category: TR3 (3 points).

ACR TI-RADS recommendations:

*Given size (>/= 1.5 - 2.4 cm) and appearance, a follow-up
ultrasound in 1 year should be considered based on TI-RADS criteria.

_________________________________________________________

Probable pseudo nodule in the mid right thyroid lobe with a
macrocalcification. This area measures 1.4 x 1.1 x 1.1 cm. Mildly
complex nodule in the mid right thyroid lobe measures 1.6 x 0.8 x
1.3 cm, previously measured 1.3 x 0.8 x 1.0 cm. There are least 2
additional heterogeneous and slightly isoechoic nodules in the mid
right thyroid lobe that measure less than 1 cm. Dominant nodule in
the inferior right thyroid lobe and this was previously biopsied in
9462. This dominant nodule is predominantly solid with some cystic
areas. The nodule is poorly defined and measures 2.5 x 1.5 x 1.9 cm
and previously measured 2.1 x 1.4 x 1.8 cm.

Mixed solid and cystic nodule in the superior left thyroid lobe
measures 2.1 x 1.0 x 1.4 cm and previously measured 1.6 x 0.8 x
cm. This appears to represent the most recently biopsied nodule. The
nodule is more cystic than it was on the previous examination. There
is a mildly heterogeneous hypoechoic nodule on the superior/mid left
thyroid lobe that measures 0.9 x 0.5 x 0.8 cm and previously 0.9 x
0.5 x 0.7 cm. Dominant isoechoic nodule in the inferior left thyroid
lobe and previously biopsied in 9462. This dominant nodule has a
central macrocalcification and measures 4.0 x 1.6 x 2.1 cm,
previously 2.7 x 1.5 x 1.9 cm.

_________________________________________________________
IMPRESSION: Multinodular goiter. There has been some interval enlargement of the
previously biopsied nodules as described.

Possible new nodules in the right thyroid lobe. The right superior
thyroid nodule measuring up to 1.6 cm meets criteria for 1 year
follow-up.

The above is in keeping with the ACR TI-RADS recommendations - [HOSPITAL] 6902;[DATE].

## 2019-03-12 DIAGNOSIS — M19012 Primary osteoarthritis, left shoulder: Secondary | ICD-10-CM | POA: Diagnosis not present

## 2019-06-12 DIAGNOSIS — F411 Generalized anxiety disorder: Secondary | ICD-10-CM | POA: Diagnosis not present

## 2019-06-12 DIAGNOSIS — Z636 Dependent relative needing care at home: Secondary | ICD-10-CM | POA: Diagnosis not present

## 2019-06-12 DIAGNOSIS — F322 Major depressive disorder, single episode, severe without psychotic features: Secondary | ICD-10-CM | POA: Diagnosis not present

## 2019-09-17 DIAGNOSIS — Z1231 Encounter for screening mammogram for malignant neoplasm of breast: Secondary | ICD-10-CM | POA: Diagnosis not present

## 2019-09-17 DIAGNOSIS — Z01419 Encounter for gynecological examination (general) (routine) without abnormal findings: Secondary | ICD-10-CM | POA: Diagnosis not present

## 2019-09-17 DIAGNOSIS — Z6824 Body mass index (BMI) 24.0-24.9, adult: Secondary | ICD-10-CM | POA: Diagnosis not present

## 2019-11-12 DIAGNOSIS — Z9189 Other specified personal risk factors, not elsewhere classified: Secondary | ICD-10-CM | POA: Diagnosis not present

## 2019-11-12 DIAGNOSIS — E785 Hyperlipidemia, unspecified: Secondary | ICD-10-CM | POA: Diagnosis not present

## 2019-11-12 DIAGNOSIS — F411 Generalized anxiety disorder: Secondary | ICD-10-CM | POA: Diagnosis not present

## 2019-11-12 DIAGNOSIS — F324 Major depressive disorder, single episode, in partial remission: Secondary | ICD-10-CM | POA: Diagnosis not present

## 2019-11-12 DIAGNOSIS — F322 Major depressive disorder, single episode, severe without psychotic features: Secondary | ICD-10-CM | POA: Diagnosis not present

## 2019-11-12 DIAGNOSIS — E042 Nontoxic multinodular goiter: Secondary | ICD-10-CM | POA: Diagnosis not present

## 2019-11-12 DIAGNOSIS — I10 Essential (primary) hypertension: Secondary | ICD-10-CM | POA: Diagnosis not present

## 2019-11-12 DIAGNOSIS — Z0000 Encounter for general adult medical examination without abnormal findings: Secondary | ICD-10-CM | POA: Diagnosis not present

## 2019-11-12 DIAGNOSIS — Z79899 Other long term (current) drug therapy: Secondary | ICD-10-CM | POA: Diagnosis not present

## 2020-02-15 DIAGNOSIS — J209 Acute bronchitis, unspecified: Secondary | ICD-10-CM | POA: Diagnosis not present

## 2020-04-15 DIAGNOSIS — B029 Zoster without complications: Secondary | ICD-10-CM | POA: Diagnosis not present

## 2020-05-12 DIAGNOSIS — Z79899 Other long term (current) drug therapy: Secondary | ICD-10-CM | POA: Diagnosis not present

## 2020-05-12 DIAGNOSIS — F325 Major depressive disorder, single episode, in full remission: Secondary | ICD-10-CM | POA: Diagnosis not present

## 2020-05-12 DIAGNOSIS — I10 Essential (primary) hypertension: Secondary | ICD-10-CM | POA: Diagnosis not present

## 2020-05-12 DIAGNOSIS — Z6379 Other stressful life events affecting family and household: Secondary | ICD-10-CM | POA: Diagnosis not present

## 2020-05-12 DIAGNOSIS — E785 Hyperlipidemia, unspecified: Secondary | ICD-10-CM | POA: Diagnosis not present

## 2020-05-12 DIAGNOSIS — M1712 Unilateral primary osteoarthritis, left knee: Secondary | ICD-10-CM | POA: Diagnosis not present

## 2020-05-12 DIAGNOSIS — F411 Generalized anxiety disorder: Secondary | ICD-10-CM | POA: Diagnosis not present

## 2020-09-02 DIAGNOSIS — M4316 Spondylolisthesis, lumbar region: Secondary | ICD-10-CM | POA: Diagnosis not present

## 2020-09-02 DIAGNOSIS — M7060 Trochanteric bursitis, unspecified hip: Secondary | ICD-10-CM | POA: Diagnosis not present

## 2020-10-21 DIAGNOSIS — M7060 Trochanteric bursitis, unspecified hip: Secondary | ICD-10-CM | POA: Diagnosis not present

## 2020-10-21 DIAGNOSIS — M4316 Spondylolisthesis, lumbar region: Secondary | ICD-10-CM | POA: Diagnosis not present

## 2020-11-12 DIAGNOSIS — Z0000 Encounter for general adult medical examination without abnormal findings: Secondary | ICD-10-CM | POA: Diagnosis not present

## 2020-11-12 DIAGNOSIS — Z79899 Other long term (current) drug therapy: Secondary | ICD-10-CM | POA: Diagnosis not present

## 2020-11-12 DIAGNOSIS — Z6379 Other stressful life events affecting family and household: Secondary | ICD-10-CM | POA: Diagnosis not present

## 2020-11-12 DIAGNOSIS — E042 Nontoxic multinodular goiter: Secondary | ICD-10-CM | POA: Diagnosis not present

## 2020-11-12 DIAGNOSIS — F321 Major depressive disorder, single episode, moderate: Secondary | ICD-10-CM | POA: Diagnosis not present

## 2020-11-12 DIAGNOSIS — F411 Generalized anxiety disorder: Secondary | ICD-10-CM | POA: Diagnosis not present

## 2020-11-12 DIAGNOSIS — M1712 Unilateral primary osteoarthritis, left knee: Secondary | ICD-10-CM | POA: Diagnosis not present

## 2020-11-12 DIAGNOSIS — E785 Hyperlipidemia, unspecified: Secondary | ICD-10-CM | POA: Diagnosis not present

## 2020-11-12 DIAGNOSIS — I10 Essential (primary) hypertension: Secondary | ICD-10-CM | POA: Diagnosis not present

## 2020-11-16 DIAGNOSIS — H35041 Retinal micro-aneurysms, unspecified, right eye: Secondary | ICD-10-CM | POA: Diagnosis not present

## 2020-11-16 DIAGNOSIS — H5211 Myopia, right eye: Secondary | ICD-10-CM | POA: Diagnosis not present

## 2020-11-16 DIAGNOSIS — H35371 Puckering of macula, right eye: Secondary | ICD-10-CM | POA: Diagnosis not present

## 2020-11-16 DIAGNOSIS — H35033 Hypertensive retinopathy, bilateral: Secondary | ICD-10-CM | POA: Diagnosis not present

## 2020-11-16 DIAGNOSIS — H26493 Other secondary cataract, bilateral: Secondary | ICD-10-CM | POA: Diagnosis not present

## 2020-11-16 DIAGNOSIS — H524 Presbyopia: Secondary | ICD-10-CM | POA: Diagnosis not present

## 2020-11-16 DIAGNOSIS — H52222 Regular astigmatism, left eye: Secondary | ICD-10-CM | POA: Diagnosis not present

## 2020-12-29 DIAGNOSIS — J208 Acute bronchitis due to other specified organisms: Secondary | ICD-10-CM | POA: Diagnosis not present

## 2021-03-07 DIAGNOSIS — H35033 Hypertensive retinopathy, bilateral: Secondary | ICD-10-CM | POA: Diagnosis not present

## 2021-03-07 DIAGNOSIS — H35371 Puckering of macula, right eye: Secondary | ICD-10-CM | POA: Diagnosis not present

## 2021-03-07 DIAGNOSIS — Z961 Presence of intraocular lens: Secondary | ICD-10-CM | POA: Diagnosis not present

## 2021-03-07 DIAGNOSIS — H43813 Vitreous degeneration, bilateral: Secondary | ICD-10-CM | POA: Diagnosis not present

## 2021-03-07 DIAGNOSIS — H26493 Other secondary cataract, bilateral: Secondary | ICD-10-CM | POA: Diagnosis not present

## 2021-03-09 DIAGNOSIS — I10 Essential (primary) hypertension: Secondary | ICD-10-CM | POA: Diagnosis not present

## 2021-03-09 DIAGNOSIS — R7303 Prediabetes: Secondary | ICD-10-CM | POA: Diagnosis not present

## 2021-03-09 DIAGNOSIS — E042 Nontoxic multinodular goiter: Secondary | ICD-10-CM | POA: Diagnosis not present

## 2021-03-09 DIAGNOSIS — E039 Hypothyroidism, unspecified: Secondary | ICD-10-CM | POA: Diagnosis not present

## 2021-03-09 DIAGNOSIS — J310 Chronic rhinitis: Secondary | ICD-10-CM | POA: Diagnosis not present

## 2021-03-09 DIAGNOSIS — E782 Mixed hyperlipidemia: Secondary | ICD-10-CM | POA: Diagnosis not present

## 2021-03-09 DIAGNOSIS — F330 Major depressive disorder, recurrent, mild: Secondary | ICD-10-CM | POA: Diagnosis not present

## 2021-03-09 DIAGNOSIS — K219 Gastro-esophageal reflux disease without esophagitis: Secondary | ICD-10-CM | POA: Diagnosis not present

## 2021-03-09 DIAGNOSIS — M8589 Other specified disorders of bone density and structure, multiple sites: Secondary | ICD-10-CM | POA: Diagnosis not present

## 2021-03-09 DIAGNOSIS — M1712 Unilateral primary osteoarthritis, left knee: Secondary | ICD-10-CM | POA: Diagnosis not present

## 2021-03-09 DIAGNOSIS — F411 Generalized anxiety disorder: Secondary | ICD-10-CM | POA: Diagnosis not present

## 2021-03-25 ENCOUNTER — Other Ambulatory Visit (HOSPITAL_COMMUNITY): Payer: Self-pay

## 2021-03-25 DIAGNOSIS — M4316 Spondylolisthesis, lumbar region: Secondary | ICD-10-CM | POA: Diagnosis not present

## 2021-03-25 DIAGNOSIS — M7061 Trochanteric bursitis, right hip: Secondary | ICD-10-CM | POA: Diagnosis not present

## 2021-04-04 DIAGNOSIS — Z01419 Encounter for gynecological examination (general) (routine) without abnormal findings: Secondary | ICD-10-CM | POA: Diagnosis not present

## 2021-04-04 DIAGNOSIS — M8588 Other specified disorders of bone density and structure, other site: Secondary | ICD-10-CM | POA: Diagnosis not present

## 2021-04-04 DIAGNOSIS — Z6823 Body mass index (BMI) 23.0-23.9, adult: Secondary | ICD-10-CM | POA: Diagnosis not present

## 2021-04-04 DIAGNOSIS — N958 Other specified menopausal and perimenopausal disorders: Secondary | ICD-10-CM | POA: Diagnosis not present

## 2021-04-04 DIAGNOSIS — Z1231 Encounter for screening mammogram for malignant neoplasm of breast: Secondary | ICD-10-CM | POA: Diagnosis not present

## 2021-04-05 DIAGNOSIS — M48062 Spinal stenosis, lumbar region with neurogenic claudication: Secondary | ICD-10-CM | POA: Diagnosis not present

## 2021-04-21 DIAGNOSIS — J019 Acute sinusitis, unspecified: Secondary | ICD-10-CM | POA: Diagnosis not present

## 2021-09-03 DIAGNOSIS — R509 Fever, unspecified: Secondary | ICD-10-CM | POA: Diagnosis not present

## 2021-09-03 DIAGNOSIS — J111 Influenza due to unidentified influenza virus with other respiratory manifestations: Secondary | ICD-10-CM | POA: Diagnosis not present

## 2021-09-09 DIAGNOSIS — R051 Acute cough: Secondary | ICD-10-CM | POA: Diagnosis not present

## 2021-09-09 DIAGNOSIS — J209 Acute bronchitis, unspecified: Secondary | ICD-10-CM | POA: Diagnosis not present

## 2021-09-15 DIAGNOSIS — E785 Hyperlipidemia, unspecified: Secondary | ICD-10-CM | POA: Diagnosis not present

## 2021-09-15 DIAGNOSIS — F330 Major depressive disorder, recurrent, mild: Secondary | ICD-10-CM | POA: Diagnosis not present

## 2021-09-15 DIAGNOSIS — E782 Mixed hyperlipidemia: Secondary | ICD-10-CM | POA: Diagnosis not present

## 2021-09-15 DIAGNOSIS — R7303 Prediabetes: Secondary | ICD-10-CM | POA: Diagnosis not present

## 2021-09-15 DIAGNOSIS — E039 Hypothyroidism, unspecified: Secondary | ICD-10-CM | POA: Diagnosis not present

## 2021-09-15 DIAGNOSIS — I10 Essential (primary) hypertension: Secondary | ICD-10-CM | POA: Diagnosis not present

## 2021-09-15 DIAGNOSIS — K219 Gastro-esophageal reflux disease without esophagitis: Secondary | ICD-10-CM | POA: Diagnosis not present

## 2021-09-15 DIAGNOSIS — M1712 Unilateral primary osteoarthritis, left knee: Secondary | ICD-10-CM | POA: Diagnosis not present

## 2021-09-27 DIAGNOSIS — I10 Essential (primary) hypertension: Secondary | ICD-10-CM | POA: Diagnosis not present

## 2021-09-27 DIAGNOSIS — R7303 Prediabetes: Secondary | ICD-10-CM | POA: Diagnosis not present

## 2021-09-27 DIAGNOSIS — E039 Hypothyroidism, unspecified: Secondary | ICD-10-CM | POA: Diagnosis not present

## 2021-09-27 DIAGNOSIS — E782 Mixed hyperlipidemia: Secondary | ICD-10-CM | POA: Diagnosis not present

## 2022-07-05 ENCOUNTER — Other Ambulatory Visit: Payer: Self-pay | Admitting: Obstetrics and Gynecology

## 2022-07-05 DIAGNOSIS — E042 Nontoxic multinodular goiter: Secondary | ICD-10-CM

## 2022-07-05 DIAGNOSIS — Z8249 Family history of ischemic heart disease and other diseases of the circulatory system: Secondary | ICD-10-CM

## 2022-07-07 ENCOUNTER — Other Ambulatory Visit: Payer: PPO

## 2022-08-22 ENCOUNTER — Ambulatory Visit
Admission: RE | Admit: 2022-08-22 | Discharge: 2022-08-22 | Disposition: A | Discharge status: Home / Self Care | Payer: PPO | Source: Ambulatory Visit | Attending: Obstetrics and Gynecology | Admitting: Obstetrics and Gynecology

## 2022-08-22 DIAGNOSIS — E042 Nontoxic multinodular goiter: Secondary | ICD-10-CM

## 2022-10-16 HISTORY — PX: TOTAL SHOULDER ARTHROPLASTY: SHX126

## 2022-10-23 ENCOUNTER — Other Ambulatory Visit: Payer: Self-pay | Admitting: Endocrinology

## 2022-10-23 DIAGNOSIS — E041 Nontoxic single thyroid nodule: Secondary | ICD-10-CM

## 2022-10-26 ENCOUNTER — Other Ambulatory Visit: Payer: PPO

## 2022-10-27 ENCOUNTER — Ambulatory Visit
Admission: RE | Admit: 2022-10-27 | Discharge: 2022-10-27 | Disposition: A | Discharge status: Home / Self Care | Payer: PPO | Source: Ambulatory Visit | Attending: Endocrinology | Admitting: Endocrinology

## 2022-10-27 ENCOUNTER — Other Ambulatory Visit (HOSPITAL_COMMUNITY)
Admission: RE | Admit: 2022-10-27 | Discharge: 2022-10-27 | Disposition: A | Discharge status: Home / Self Care | Payer: PPO | Source: Ambulatory Visit | Attending: Endocrinology | Admitting: Endocrinology

## 2022-10-27 DIAGNOSIS — E041 Nontoxic single thyroid nodule: Secondary | ICD-10-CM | POA: Insufficient documentation

## 2022-10-27 NOTE — Procedures (Signed)
Interventional Radiology Procedure Note  Procedure: US guided right thyroid nodule.  Superior 1.6cm.  FNA performed with AFIRMA  Complications: None  Recommendations:  - Ok to shower tomorrow - Do not submerge for 7 days - Routine care   Signed,  Dulcy Fanny. Earleen Newport, DO

## 2022-10-31 LAB — CYTOLOGY - NON PAP

## 2022-11-02 ENCOUNTER — Other Ambulatory Visit: Payer: PPO

## 2022-12-11 DIAGNOSIS — Z01818 Encounter for other preprocedural examination: Secondary | ICD-10-CM | POA: Diagnosis not present

## 2023-07-31 ENCOUNTER — Other Ambulatory Visit: Payer: Self-pay | Admitting: Obstetrics and Gynecology

## 2023-07-31 DIAGNOSIS — E041 Nontoxic single thyroid nodule: Secondary | ICD-10-CM

## 2023-08-06 ENCOUNTER — Ambulatory Visit
Admission: RE | Admit: 2023-08-06 | Discharge: 2023-08-06 | Disposition: A | Discharge status: Home / Self Care | Payer: PPO | Source: Ambulatory Visit | Attending: Obstetrics and Gynecology | Admitting: Obstetrics and Gynecology

## 2023-08-06 DIAGNOSIS — E041 Nontoxic single thyroid nodule: Secondary | ICD-10-CM

## 2024-01-16 ENCOUNTER — Other Ambulatory Visit (INDEPENDENT_AMBULATORY_CARE_PROVIDER_SITE_OTHER): Payer: Self-pay

## 2024-01-16 ENCOUNTER — Encounter: Payer: Self-pay | Admitting: Orthopaedic Surgery

## 2024-01-16 ENCOUNTER — Ambulatory Visit: Admitting: Orthopaedic Surgery

## 2024-01-16 VITALS — Ht 63.25 in | Wt 140.0 lb

## 2024-01-16 DIAGNOSIS — M25552 Pain in left hip: Secondary | ICD-10-CM | POA: Diagnosis not present

## 2024-01-16 DIAGNOSIS — M1612 Unilateral primary osteoarthritis, left hip: Secondary | ICD-10-CM | POA: Insufficient documentation

## 2024-01-16 NOTE — Progress Notes (Signed)
 The patient is a very pleasant and active 78 year old female who actually used to work in the [employer]  She comes in with a chief complaint of left hip and groin pain.  She does have some chronic low back issues as well.  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 Louis Meckel and she has done very well with those.  She has been developing worsening left hip pain for over a year now.  She was taken diclofenac but now it is bothering her stomach too much with a history of reflux.  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  At this point she is having a harder time crossing her legs on the left side and putting her shoes and socks on that side.  It is detrimentally affecting her mobility, her quality of life and her actives daily living.  Her pain is on a daily basis.  She still works even part-time at [employer].  I was able to review all of her medications and past medical history within epic.  She is a thin individual and nondiabetic and not on blood thinning medications.  When she walks she does walk leaning forward slightly and does have a limp.  Her left hip has significant pain with internal and external rotation as well as stiffness in the joint with rotation.  Her right hip moves smoothly and fluidly.  An AP pelvis and lateral left hip shows significant arthritic change of the left hip with superior lateral joint space narrowing.  There is osteophytes around the femoral head and neck as well as sclerotic changes.  There are some cystic changes as well.  Her right hip joint space is well-maintained and the femoral head is nice and round on the right side.  We had a long and thorough discussion about hip replacement surgery.  She does wish to proceed with replacement surgery given the disability that her left hip pain and arthritis is causing her.  I think this is going to help her posture and her back as I see her walk.  I gave  her handout about hip replacement surgery and went over her x-rays as well as a hip replacement model.  We discussed the risks and benefits of the surgery and what to expect from an intraoperative and postoperative standpoint.  We will be in touch with scheduling the surgery.  All questions and concerns were addressed and answered.

## 2024-02-07 NOTE — Patient Instructions (Signed)
 DUE TO COVID-19 ONLY TWO VISITORS  (aged 78 and older)  ARE ALLOWED TO COME WITH YOU AND STAY IN THE WAITING ROOM ONLY DURING PRE OP AND PROCEDURE.   **NO VISITORS ARE ALLOWED IN THE SHORT STAY AREA OR RECOVERY ROOM!!**  IF YOU WILL BE ADMITTED INTO THE HOSPITAL YOU ARE ALLOWED ONLY FOUR SUPPORT PEOPLE DURING VISITATION HOURS ONLY (7 AM -8PM)   The support person(s) must pass our screening, gel in and out, and wear a mask at all times, including in the patient's room. Patients must also wear a mask when staff or their support person are in the room. Visitors GUEST BADGE MUST BE WORN VISIBLY  One adult visitor may remain with you overnight and MUST be in the room by 8 P.M.     Your procedure is scheduled on: 02/15/24   Report to [facility] Main Entrance    Report to admitting at : 11:30 AM   Call this number if you have problems the morning of surgery [phone]   Do not eat food :After Midnight.   After Midnight you may have the following liquids until : 11:00 AM DAY OF SURGERY  Water Black Coffee (sugar ok, NO MILK/CREAM OR CREAMERS)  Tea (sugar ok, NO MILK/CREAM OR CREAMERS) regular and decaf                             Plain Jell-O (NO RED)                                           Fruit ices (not with fruit pulp, NO RED)                                     Popsicles (NO RED)                                                                  Juice: apple, WHITE grape, WHITE cranberry Sports drinks like Gatorade (NO RED)   The day of surgery:  Drink ONE (1) Pre-Surgery Clear Ensure at : 11:00 AM the morning of surgery. Drink in one sitting. Do not sip.  This drink was given to you during your hospital  pre-op appointment visit. Nothing else to drink after completing the  Pre-Surgery Clear Ensure or G2.          If you have questions, please contact your surgeon's office.  FOLLOW ANY ADDITIONAL PRE OP INSTRUCTIONS YOU RECEIVED FROM YOUR SURGEON'S OFFICE!!!   Oral  Hygiene is also important to reduce your risk of infection.                                    Remember - BRUSH YOUR TEETH THE MORNING OF SURGERY WITH YOUR REGULAR TOOTHPASTE  DENTURES WILL BE REMOVED PRIOR TO SURGERY PLEASE DO NOT APPLY "Poly grip" OR ADHESIVES!!!   Do NOT smoke after Midnight   Take these medicines the morning of surgery  with A SIP OF WATER: buspirone,amlodipine,levothyroxine.Tylenol as needed.  STOP TAKING all Vitamins, Herbs and supplements 1 week before your surgery.                               You may not have any metal on your body including hair pins, jewelry, and body piercing             Do not wear make-up, lotions, powders, perfumes/cologne, or deodorant  Do not wear nail polish including gel and S&S, artificial/acrylic nails, or any other type of covering on natural nails including finger and toenails. If you have artificial nails, gel coating, etc. that needs to be removed by a nail salon please have this removed prior to surgery or surgery may need to be canceled/ delayed if the surgeon/ anesthesia feels like they are unable to be safely monitored.   Do not shave  48 hours prior to surgery.              Do not bring valuables to the hospital. [place] IS NOT             RESPONSIBLE   FOR VALUABLES.   Contacts, glasses, or bridgework may not be worn into surgery.   Bring small overnight bag day of surgery.   DO NOT BRING YOUR HOME MEDICATIONS TO THE HOSPITAL. PHARMACY WILL DISPENSE MEDICATIONS LISTED ON YOUR MEDICATION LIST TO YOU DURING YOUR ADMISSION IN THE HOSPITAL!    Patients discharged on the day of surgery will not be allowed to drive home.  Someone NEEDS to stay with you for the first 24 hours after anesthesia.   Special Instructions: Bring a copy of your healthcare power of attorney and living will documents         the day of surgery if you haven't scanned them before.              Please read over the following fact sheets you were  given: IF YOU HAVE QUESTIONS ABOUT YOUR PRE-OP INSTRUCTIONS PLEASE CALL [phone]      Pre-operative 5 CHG Bath Instructions   You can play a key role in reducing the risk of infection after surgery. Your skin needs to be as free of germs as possible. You can reduce the number of germs on your skin by washing with CHG (chlorhexidine gluconate) soap before surgery. CHG is an antiseptic soap that kills germs and continues to kill germs even after washing.   DO NOT use if you have an allergy to chlorhexidine/CHG or antibacterial soaps. If your skin becomes reddened or irritated, stop using the CHG and notify one of our RNs at [phone].   Please shower with the CHG soap starting 4 days before surgery using the following schedule:     Please keep in mind the following:  DO NOT shave, including legs and underarms, starting the day of your first shower.   You may shave your face at any point before/day of surgery.  Place clean sheets on your bed the day you start using CHG soap. Use a clean washcloth (not used since being washed) for each shower. DO NOT sleep with pets once you start using the CHG.   CHG Shower Instructions:  If you choose to wash your hair and private area, wash first with your normal shampoo/soap.  After you use shampoo/soap, rinse your hair and body thoroughly to remove shampoo/soap residue.  Turn the water OFF  and apply about 3 tablespoons (45 ml) of CHG soap to a CLEAN washcloth.  Apply CHG soap ONLY FROM YOUR NECK DOWN TO YOUR TOES (washing for 3-5 minutes)  DO NOT use CHG soap on face, private areas, open wounds, or sores.  Pay special attention to the area where your surgery is being performed.  If you are having back surgery, having someone wash your back for you may be helpful. Wait 2 minutes after CHG soap is applied, then you may rinse off the CHG soap.  Pat dry with a clean towel  Put on clean clothes/pajamas   If you choose to wear lotion, please use  ONLY the CHG-compatible lotions on the back of this paper.     Additional instructions for the day of surgery: DO NOT APPLY any lotions, deodorants, cologne, or perfumes.   Put on clean/comfortable clothes.  Brush your teeth.  Ask your nurse before applying any prescription medications to the skin.   CHG Compatible Lotions   Aveeno Moisturizing lotion  Cetaphil Moisturizing Cream  Cetaphil Moisturizing Lotion  Clairol Herbal Essence Moisturizing Lotion, Dry Skin  Clairol Herbal Essence Moisturizing Lotion, Extra Dry Skin  Clairol Herbal Essence Moisturizing Lotion, Normal Skin  Curel Age Defying Therapeutic Moisturizing Lotion with Alpha Hydroxy  Curel Extreme Care Body Lotion  Curel Soothing Hands Moisturizing Hand Lotion  Curel Therapeutic Moisturizing Cream, Fragrance-Free  Curel Therapeutic Moisturizing Lotion, Fragrance-Free  Curel Therapeutic Moisturizing Lotion, Original Formula  Eucerin Daily Replenishing Lotion  Eucerin Dry Skin Therapy Plus Alpha Hydroxy Crme  Eucerin Dry Skin Therapy Plus Alpha Hydroxy Lotion  Eucerin Original Crme  Eucerin Original Lotion  Eucerin Plus Crme Eucerin Plus Lotion  Eucerin TriLipid Replenishing Lotion  Keri Anti-Bacterial Hand Lotion  Keri Deep Conditioning Original Lotion Dry Skin Formula Softly Scented  Keri Deep Conditioning Original Lotion, Fragrance Free Sensitive Skin Formula  Keri Lotion Fast Absorbing Fragrance Free Sensitive Skin Formula  Keri Lotion Fast Absorbing Softly Scented Dry Skin Formula  Keri Original Lotion  Keri Skin Renewal Lotion Keri Silky Smooth Lotion  Keri Silky Smooth Sensitive Skin Lotion  Nivea Body Creamy Conditioning Oil  Nivea Body Extra Enriched Lotion  Nivea Body Original Lotion  Nivea Body Sheer Moisturizing Lotion Nivea Crme  Nivea Skin Firming Lotion  NutraDerm 30 Skin Lotion  NutraDerm Skin Lotion  NutraDerm Therapeutic Skin Cream  NutraDerm Therapeutic Skin Lotion  ProShield Protective  Hand Cream  Provon moisturizing lotion   Incentive Spirometer  An incentive spirometer is a tool that can help keep your lungs clear and active. This tool measures how well you are filling your lungs with each breath. Taking long deep breaths may help reverse or decrease the chance of developing breathing (pulmonary) problems (especially infection) following: A long period of time when you are unable to move or be active. BEFORE THE PROCEDURE  If the spirometer includes an indicator to show your best effort, your nurse or respiratory therapist will set it to a desired goal. If possible, sit up straight or lean slightly forward. Try not to slouch. Hold the incentive spirometer in an upright position. INSTRUCTIONS FOR USE  Sit on the edge of your bed if possible, or sit up as far as you can in bed or on a chair. Hold the incentive spirometer in an upright position. Breathe out normally. Place the mouthpiece in your mouth and seal your lips tightly around it. Breathe in slowly and as deeply as possible, raising the piston or the ball toward the  top of the column. Hold your breath for 3-5 seconds or for as long as possible. Allow the piston or ball to fall to the bottom of the column. Remove the mouthpiece from your mouth and breathe out normally. Rest for a few seconds and repeat Steps 1 through 7 at least 10 times every 1-2 hours when you are awake. Take your time and take a few normal breaths between deep breaths. The spirometer may include an indicator to show your best effort. Use the indicator as a goal to work toward during each repetition. After each set of 10 deep breaths, practice coughing to be sure your lungs are clear. If you have an incision (the cut made at the time of surgery), support your incision when coughing by placing a pillow or rolled up towels firmly against it. Once you are able to get out of bed, walk around indoors and cough well. You may stop using the incentive  spirometer when instructed by your caregiver.  RISKS AND COMPLICATIONS Take your time so you do not get dizzy or light-headed. If you are in pain, you may need to take or ask for pain medication before doing incentive spirometry. It is harder to take a deep breath if you are having pain. AFTER USE Rest and breathe slowly and easily. It can be helpful to keep track of a log of your progress. Your caregiver can provide you with a simple table to help with this. If you are using the spirometer at home, follow these instructions: SEEK MEDICAL CARE IF:  You are having difficultly using the spirometer. You have trouble using the spirometer as often as instructed. Your pain medication is not giving enough relief while using the spirometer. You develop fever of 100.5 F (38.1 C) or higher. SEEK IMMEDIATE MEDICAL CARE IF:  You cough up bloody sputum that had not been present before. You develop fever of 102 F (38.9 C) or greater. You develop worsening pain at or near the incision site. MAKE SURE YOU:  Understand these instructions. Will watch your condition. Will get help right away if you are not doing well or get worse. Document Released: 02/12/2007 Document Revised: 12/25/2011 Document Reviewed: 04/15/2007 [facility] Patient Information 2014 [place], [place].   ________________________________________________________________________

## 2024-02-11 ENCOUNTER — Encounter (HOSPITAL_COMMUNITY)
Admission: RE | Admit: 2024-02-11 | Discharge: 2024-02-11 | Disposition: A | Discharge status: Home / Self Care | Source: Ambulatory Visit | Attending: Orthopaedic Surgery | Admitting: Orthopaedic Surgery

## 2024-02-11 ENCOUNTER — Encounter (HOSPITAL_COMMUNITY): Payer: Self-pay

## 2024-02-11 ENCOUNTER — Other Ambulatory Visit: Payer: Self-pay

## 2024-02-11 ENCOUNTER — Telehealth: Payer: Self-pay | Admitting: Orthopaedic Surgery

## 2024-02-11 VITALS — BP 130/75 | HR 63 | Temp 98.0°F | Ht 63.0 in | Wt 142.0 lb

## 2024-02-11 DIAGNOSIS — Z01818 Encounter for other preprocedural examination: Secondary | ICD-10-CM | POA: Insufficient documentation

## 2024-02-11 DIAGNOSIS — M1612 Unilateral primary osteoarthritis, left hip: Secondary | ICD-10-CM | POA: Diagnosis not present

## 2024-02-11 DIAGNOSIS — Z01810 Encounter for preprocedural cardiovascular examination: Secondary | ICD-10-CM | POA: Diagnosis present

## 2024-02-11 DIAGNOSIS — I10 Essential (primary) hypertension: Secondary | ICD-10-CM | POA: Diagnosis not present

## 2024-02-11 DIAGNOSIS — Z01812 Encounter for preprocedural laboratory examination: Secondary | ICD-10-CM | POA: Diagnosis present

## 2024-02-11 HISTORY — DX: Essential (primary) hypertension: I10

## 2024-02-11 HISTORY — DX: Hypothyroidism, unspecified: E03.9

## 2024-02-11 HISTORY — DX: Unspecified osteoarthritis, unspecified site: M19.90

## 2024-02-11 HISTORY — DX: Anxiety disorder, unspecified: F41.9

## 2024-02-11 HISTORY — DX: Prediabetes: R73.03

## 2024-02-11 HISTORY — DX: Depression, unspecified: F32.A

## 2024-02-11 LAB — BASIC METABOLIC PANEL WITH GFR
Anion gap: 10 (ref 5–15)
BUN: 20 mg/dL (ref 8–23)
CO2: 26 mmol/L (ref 22–32)
Calcium: 9.6 mg/dL (ref 8.9–10.3)
Chloride: 97 mmol/L — ABNORMAL LOW (ref 98–111)
Creatinine, Ser: 0.76 mg/dL (ref 0.44–1.00)
GFR, Estimated: >60 mL/min (ref >60)
Glucose, Bld: 93 mg/dL (ref 70–99)
Potassium: 4.2 mmol/L (ref 3.5–5.1)
Sodium: 133 mmol/L — ABNORMAL LOW (ref 135–145)

## 2024-02-11 LAB — SURGICAL PCR SCREEN
MRSA, PCR: NEGATIVE
Staphylococcus aureus: POSITIVE — AB

## 2024-02-11 LAB — CBC
HCT: 43.4 % (ref 36.0–46.0)
Hemoglobin: 14.1 g/dL (ref 12.0–15.0)
MCH: 30.8 pg (ref 26.0–34.0)
MCHC: 32.5 g/dL (ref 30.0–36.0)
MCV: 94.8 fL (ref 80.0–100.0)
Platelets: 317 10*3/uL (ref 150–400)
RBC: 4.58 MIL/uL (ref 3.87–5.11)
RDW: 13.1 % (ref 11.5–15.5)
WBC: 6.9 10*3/uL (ref 4.0–10.5)
nRBC: 0 % (ref 0.0–0.2)

## 2024-02-11 NOTE — Progress Notes (Signed)
 For Anesthesia: PCP - Abbe Hoard., MD  Cardiologist -   Bowel Prep reminder:  Chest x-ray -  EKG - 02/11/24 Stress Test -  ECHO - 07/25/18 Cardiac Cath -  Pacemaker/ICD device last checked: Pacemaker orders received: Device Rep notified:  Spinal Cord Stimulator:N/A  Sleep Study - N/A CPAP -   Fasting Blood Sugar - N/A Checks Blood Sugar _____ times a day Date and result of last Hgb A1c-5.6: 10/31/23  Last dose of GLP1 agonist- N/A GLP1 instructions:   Last dose of SGLT-2 inhibitors- N/A SGLT-2 instructions:   Blood Thinner Instructions:N/A Aspirin Instructions: Last Dose:  Activity level: Can go up a flight of stairs and activities of daily living without stopping and without chest pain and/or shortness of breath   Able to exercise without chest pain and/or shortness of breath  Anesthesia review: Hx: HTN,Pre-DIA.  Patient denies shortness of breath, fever, cough and chest pain at PAT appointment   Patient verbalized understanding of instructions that were given to them at the PAT appointment. Patient was also instructed that they will need to review over the PAT instructions again at home before surgery.

## 2024-02-11 NOTE — Telephone Encounter (Signed)
 Error

## 2024-02-12 NOTE — Progress Notes (Signed)
PCR: + STAPH °

## 2024-02-14 NOTE — H&P (Signed)
 TOTAL HIP ADMISSION H&P  Patient is admitted for left total hip arthroplasty.  Subjective:  Chief Complaint: left hip pain  HPI: Rhonda Blair, 78 y.o. female, has a history of pain and functional disability in the left hip(s) due to arthritis and patient has failed non-surgical conservative treatments for greater than 12 weeks to include NSAID's and/or analgesics, flexibility and strengthening excercises, and activity modification.  Onset of symptoms was gradual starting 1 years ago with gradually worsening course since that time.The patient noted no past surgery on the left hip(s).  Patient currently rates pain in the left hip at 10 out of 10 with activity. Patient has night pain, worsening of pain with activity and weight bearing, pain that interfers with activities of daily living, and pain with passive range of motion. Patient has evidence of subchondral sclerosis, periarticular osteophytes, and joint space narrowing by imaging studies. This condition presents safety issues increasing the risk of falls.  There is no current active infection.  Patient Active Problem List   Diagnosis Date Noted   Unilateral primary osteoarthritis, left hip 01/16/2024   Multinodular goiter (nontoxic) 01/08/2013   Past Medical History:  Diagnosis Date   Anxiety    Arthritis    Depression    GERD (gastroesophageal reflux disease)    Hypertension    Hypothyroidism    Pre-diabetes    Thyroid  disease     Past Surgical History:  Procedure Laterality Date   AXILLARY LYMPH NODE BIOPSY     CARPAL TUNNEL RELEASE     CATARACT EXTRACTION, BILATERAL     COLONOSCOPY     ESOPHAGOGASTRODUODENOSCOPY ENDOSCOPY     NASAL SINUS SURGERY     TONSILLECTOMY AND ADENOIDECTOMY     TOTAL KNEE ARTHROPLASTY Right 2018   TOTAL SHOULDER ARTHROPLASTY Left 2024   TUBAL LIGATION      No current facility-administered medications for this encounter.   Current Outpatient Medications  Medication Sig Dispense Refill Last  Dose/Taking   acetaminophen  (TYLENOL ) 650 MG CR tablet Take 1,300 mg by mouth every 8 (eight) hours.   Taking   amLODipine (NORVASC) 5 MG tablet Take 5 mg by mouth in the morning.   Taking   Ascorbic Acid (VITAMIN C PO) Take 1 tablet by mouth in the morning.   Taking   busPIRone  (BUSPAR ) 10 MG tablet Take 10 mg by mouth See admin instructions. Take 1 tablet (10 mg) by mouth scheduled every morning & may take an additional 2 doses (10 mg) by mouth if needed for anxiety.   Taking   Calcium Carb-Cholecalciferol (OYSTER SHELL CALCIUM/D3 PO) Take 1-2 tablets by mouth See admin instructions. Take 1 tablet by mouth in the morning & take 2 tablets in the evening.   Taking   Camphor-Menthol-Methyl Sal (SALONPAS) 3.10-21-08 % PTCH Place 1 patch onto the skin daily as needed (pain.).   Taking As Needed   citalopram  (CELEXA ) 40 MG tablet Take 40 mg by mouth every evening.   Taking   famotidine  (PEPCID ) 40 MG tablet Take 40 mg by mouth every evening.   Taking   fluticasone  (FLONASE ) 50 MCG/ACT nasal spray Place 2 sprays into both nostrils in the morning.   Taking   GLUCOSAMINE CHONDROITIN COMPLX PO Take 1 capsule by mouth in the morning and at bedtime.   Taking   hydrochlorothiazide (HYDRODIURIL) 25 MG tablet Take 25 mg by mouth in the morning.   Taking   hydroxypropyl methylcellulose / hypromellose (ISOPTO TEARS / GONIOVISC) 2.5 % ophthalmic solution Place  1-2 drops into both eyes 3 (three) times daily as needed (dry/irritated eyes.).   Taking As Needed   levothyroxine (SYNTHROID, LEVOTHROID) 50 MCG tablet Take 50 mcg by mouth daily before breakfast.   Taking   loratadine  (CLARITIN ) 10 MG tablet Take 10 mg by mouth every evening.   Taking   losartan (COZAAR) 100 MG tablet Take 100 mg by mouth in the morning.   Taking   mirabegron  ER (MYRBETRIQ ) 50 MG TB24 tablet Take 50 mg by mouth every evening.   Taking   Multiple Vitamin (MULTIVITAMIN WITH MINERALS) TABS tablet Take 1 tablet by mouth in the morning.   Taking    rosuvastatin (CRESTOR) 10 MG tablet Take 10 mg by mouth every Tuesday, Thursday, Saturday, and Sunday at 6 PM. Sundays, Tuesdays, Thursdays & Saturdays in the evening.   Taking   Allergies  Allergen Reactions   Lisinopril Cough and Other (See Comments)   Oxycodone Nausea And Vomiting and Other (See Comments)   Pneumococcal Vaccines Other (See Comments)    Swelling/hot at the injection site (loss of adipose tissue)    Social History   Tobacco Use   Smoking status: Never   Smokeless tobacco: Not on file  Substance Use Topics   Alcohol use: No    Family History  Problem Relation Age of Onset   Heart disease Mother    Hypertension Mother    Hypertension Father    Heart disease Father      Review of Systems  Objective:  Physical Exam Vitals reviewed.  Constitutional:      Appearance: Normal appearance. She is normal weight.  HENT:     Head: Normocephalic and atraumatic.  Eyes:     Extraocular Movements: Extraocular movements intact.     Pupils: Pupils are equal, round, and reactive to light.  Cardiovascular:     Rate and Rhythm: Normal rate and regular rhythm.  Pulmonary:     Effort: Pulmonary effort is normal.     Breath sounds: Normal breath sounds.  Abdominal:     Palpations: Abdomen is soft.  Musculoskeletal:     Cervical back: Normal range of motion and neck supple.     Left hip: Tenderness and bony tenderness present. Decreased range of motion. Decreased strength.  Neurological:     Mental Status: She is alert and oriented to person, place, and time.  Psychiatric:        Behavior: Behavior normal.     Vital signs in last 24 hours:    Labs:   Estimated body mass index is 25.15 kg/m as calculated from the following:   Height as of 02/11/24: 5\' 3"  (1.6 m).   Weight as of 02/11/24: 64.4 kg.   Imaging Review Plain radiographs demonstrate severe degenerative joint disease of the left hip(s). The bone quality appears to be excellent for age and reported  activity level.      Assessment/Plan:  End stage arthritis, left hip(s)  The patient history, physical examination, clinical judgement of the provider and imaging studies are consistent with end stage degenerative joint disease of the left hip(s) and total hip arthroplasty is deemed medically necessary. The treatment options including medical management, injection therapy, arthroscopy and arthroplasty were discussed at length. The risks and benefits of total hip arthroplasty were presented and reviewed. The risks due to aseptic loosening, infection, stiffness, dislocation/subluxation,  thromboembolic complications and other imponderables were discussed.  The patient acknowledged the explanation, agreed to proceed with the plan and consent was signed. Patient  is being admitted for inpatient treatment for surgery, pain control, PT, OT, prophylactic antibiotics, VTE prophylaxis, progressive ambulation and ADL's and discharge planning.The patient is planning to be discharged home with home health services

## 2024-02-15 ENCOUNTER — Observation Stay (HOSPITAL_COMMUNITY)

## 2024-02-15 ENCOUNTER — Encounter (HOSPITAL_COMMUNITY)
Admission: RE | Disposition: A | Discharge status: Home Health | Payer: Self-pay | Source: Home / Self Care | Attending: Orthopaedic Surgery

## 2024-02-15 ENCOUNTER — Other Ambulatory Visit: Payer: Self-pay

## 2024-02-15 ENCOUNTER — Ambulatory Visit (HOSPITAL_COMMUNITY): Payer: Self-pay | Admitting: Certified Registered Nurse Anesthetist

## 2024-02-15 ENCOUNTER — Observation Stay (HOSPITAL_COMMUNITY)
Admission: RE | Admit: 2024-02-15 | Discharge: 2024-02-16 | Disposition: A | Discharge status: Home Health | Attending: Orthopaedic Surgery | Admitting: Orthopaedic Surgery

## 2024-02-15 ENCOUNTER — Encounter (HOSPITAL_COMMUNITY): Payer: Self-pay | Admitting: Orthopaedic Surgery

## 2024-02-15 ENCOUNTER — Ambulatory Visit (HOSPITAL_COMMUNITY)

## 2024-02-15 DIAGNOSIS — Z79899 Other long term (current) drug therapy: Secondary | ICD-10-CM | POA: Insufficient documentation

## 2024-02-15 DIAGNOSIS — I10 Essential (primary) hypertension: Secondary | ICD-10-CM | POA: Insufficient documentation

## 2024-02-15 DIAGNOSIS — Z96642 Presence of left artificial hip joint: Secondary | ICD-10-CM

## 2024-02-15 DIAGNOSIS — Z96612 Presence of left artificial shoulder joint: Secondary | ICD-10-CM | POA: Insufficient documentation

## 2024-02-15 DIAGNOSIS — E039 Hypothyroidism, unspecified: Secondary | ICD-10-CM | POA: Diagnosis not present

## 2024-02-15 DIAGNOSIS — M1612 Unilateral primary osteoarthritis, left hip: Secondary | ICD-10-CM

## 2024-02-15 DIAGNOSIS — Z96651 Presence of right artificial knee joint: Secondary | ICD-10-CM | POA: Insufficient documentation

## 2024-02-15 HISTORY — PX: TOTAL HIP ARTHROPLASTY: SHX124

## 2024-02-15 LAB — TYPE AND SCREEN
ABO/RH(D): A POS
Antibody Screen: NEGATIVE

## 2024-02-15 LAB — ABO/RH: ABO/RH(D): A POS

## 2024-02-15 SURGERY — ARTHROPLASTY, HIP, TOTAL, ANTERIOR APPROACH
Anesthesia: Spinal | Site: Hip | Laterality: Left

## 2024-02-15 MED ORDER — ORAL CARE MOUTH RINSE: 15.0000 mL | Freq: Once | OROMUCOSAL | Status: AC

## 2024-02-15 MED ORDER — BUSPIRONE HCL 10 MG PO TABS: 10.0000 mg | ORAL_TABLET | Freq: Two times a day (BID) | ORAL | Status: DC | PRN

## 2024-02-15 MED ORDER — DOCUSATE SODIUM 100 MG PO CAPS
100.0000 mg | ORAL_CAPSULE | Freq: Two times a day (BID) | ORAL | Status: DC
  Administered 2024-02-15 – 2024-02-16 (×2): 100 mg via ORAL
  Filled 2024-02-15: qty 1

## 2024-02-15 MED ORDER — LOSARTAN POTASSIUM 50 MG PO TABS
100.0000 mg | ORAL_TABLET | Freq: Every day | ORAL | Status: DC
  Administered 2024-02-16: 100 mg via ORAL
  Filled 2024-02-15: qty 2

## 2024-02-15 MED ORDER — MUPIROCIN 2 % EX OINT: 1.0000 | TOPICAL_OINTMENT | Freq: Two times a day (BID) | CUTANEOUS | 0 refills | Status: DC

## 2024-02-15 MED ORDER — MORPHINE SULFATE (PF) 2 MG/ML IV SOLN: 0.5000 mg | INTRAVENOUS | Status: DC | PRN

## 2024-02-15 MED ORDER — SODIUM CHLORIDE 0.9 % IR SOLN
Status: DC | PRN
  Administered 2024-02-15: 1000 mL

## 2024-02-15 MED ORDER — METOCLOPRAMIDE HCL 5 MG PO TABS: 5.0000 mg | ORAL_TABLET | Freq: Three times a day (TID) | ORAL | Status: DC | PRN

## 2024-02-15 MED ORDER — BUSPIRONE HCL 10 MG PO TABS
10.0000 mg | ORAL_TABLET | Freq: Every day | ORAL | Status: DC
  Administered 2024-02-16: 10 mg via ORAL
  Filled 2024-02-15 (×2): qty 1

## 2024-02-15 MED ORDER — ONDANSETRON HCL 4 MG PO TABS: 4.0000 mg | ORAL_TABLET | Freq: Four times a day (QID) | ORAL | Status: DC | PRN

## 2024-02-15 MED ORDER — SODIUM CHLORIDE 0.9 % IV SOLN: INTRAVENOUS | Status: DC

## 2024-02-15 MED ORDER — CEFAZOLIN SODIUM-DEXTROSE 2-4 GM/100ML-% IV SOLN
2.0000 g | Freq: Four times a day (QID) | INTRAVENOUS | Status: AC
  Administered 2024-02-15 – 2024-02-16 (×2): 2 g via INTRAVENOUS
  Filled 2024-02-15 (×2): qty 100

## 2024-02-15 MED ORDER — POVIDONE-IODINE 10 % EX SWAB: 2.0000 | Freq: Once | CUTANEOUS | Status: DC

## 2024-02-15 MED ORDER — CHLORHEXIDINE GLUCONATE 4 % EX SOLN: 1.0000 | CUTANEOUS | 1 refills | Status: AC

## 2024-02-15 MED ORDER — HYDROCODONE-ACETAMINOPHEN 5-325 MG PO TABS
1.0000 | ORAL_TABLET | ORAL | Status: DC | PRN
  Administered 2024-02-16 (×3): 2 via ORAL
  Filled 2024-02-15 (×3): qty 2

## 2024-02-15 MED ORDER — FAMOTIDINE 20 MG PO TABS
40.0000 mg | ORAL_TABLET | Freq: Every evening | ORAL | Status: DC
  Administered 2024-02-15: 40 mg via ORAL
  Filled 2024-02-15: qty 2

## 2024-02-15 MED ORDER — METHOCARBAMOL 500 MG PO TABS
500.0000 mg | ORAL_TABLET | Freq: Four times a day (QID) | ORAL | Status: DC | PRN
  Administered 2024-02-15: 500 mg via ORAL
  Filled 2024-02-15: qty 1

## 2024-02-15 MED ORDER — ONDANSETRON HCL 4 MG/2ML IJ SOLN
4.0000 mg | Freq: Four times a day (QID) | INTRAMUSCULAR | Status: DC | PRN
  Administered 2024-02-15: 4 mg via INTRAVENOUS
  Filled 2024-02-15: qty 2

## 2024-02-15 MED ORDER — SODIUM CHLORIDE 0.9 % IV SOLN: 12.5000 mg | INTRAVENOUS | Status: DC | PRN

## 2024-02-15 MED ORDER — FENTANYL CITRATE (PF) 100 MCG/2ML IJ SOLN
INTRAMUSCULAR | Status: AC
  Filled 2024-02-15: qty 2

## 2024-02-15 MED ORDER — LEVOTHYROXINE SODIUM 50 MCG PO TABS
50.0000 ug | ORAL_TABLET | Freq: Every day | ORAL | Status: DC
Start: 2024-02-16 — End: 2024-02-16
  Administered 2024-02-16: 50 ug via ORAL
  Filled 2024-02-15: qty 1

## 2024-02-15 MED ORDER — FLUTICASONE PROPIONATE 50 MCG/ACT NA SUSP
2.0000 | Freq: Every day | NASAL | Status: DC
  Administered 2024-02-16: 2 via NASAL
  Filled 2024-02-15: qty 16

## 2024-02-15 MED ORDER — ASPIRIN 81 MG PO CHEW
81.0000 mg | CHEWABLE_TABLET | Freq: Two times a day (BID) | ORAL | Status: DC
  Administered 2024-02-15 – 2024-02-16 (×2): 81 mg via ORAL
  Filled 2024-02-15: qty 1

## 2024-02-15 MED ORDER — METOCLOPRAMIDE HCL 5 MG/ML IJ SOLN: 5.0000 mg | Freq: Three times a day (TID) | INTRAMUSCULAR | Status: DC | PRN

## 2024-02-15 MED ORDER — FENTANYL CITRATE (PF) 100 MCG/2ML IJ SOLN
INTRAMUSCULAR | Status: DC | PRN
  Administered 2024-02-15 (×2): 50 ug via INTRAVENOUS

## 2024-02-15 MED ORDER — ACETAMINOPHEN 325 MG PO TABS: 325.0000 mg | ORAL_TABLET | Freq: Four times a day (QID) | ORAL | Status: DC | PRN

## 2024-02-15 MED ORDER — MENTHOL 3 MG MT LOZG: 1.0000 | LOZENGE | OROMUCOSAL | Status: DC | PRN

## 2024-02-15 MED ORDER — AMLODIPINE BESYLATE 5 MG PO TABS
5.0000 mg | ORAL_TABLET | Freq: Every day | ORAL | Status: DC
  Administered 2024-02-16: 5 mg via ORAL
  Filled 2024-02-15: qty 1

## 2024-02-15 MED ORDER — STERILE WATER FOR IRRIGATION IR SOLN
Status: DC | PRN
  Administered 2024-02-15: 1000 mL

## 2024-02-15 MED ORDER — POLYVINYL ALCOHOL 1.4 % OP SOLN: 1.0000 [drp] | Freq: Three times a day (TID) | OPHTHALMIC | Status: DC | PRN

## 2024-02-15 MED ORDER — CITALOPRAM HYDROBROMIDE 20 MG PO TABS
40.0000 mg | ORAL_TABLET | Freq: Every evening | ORAL | Status: DC
  Administered 2024-02-15: 40 mg via ORAL
  Filled 2024-02-15: qty 2

## 2024-02-15 MED ORDER — PHENOL 1.4 % MT LIQD: 1.0000 | OROMUCOSAL | Status: DC | PRN

## 2024-02-15 MED ORDER — PHENYLEPHRINE HCL-NACL 20-0.9 MG/250ML-% IV SOLN
INTRAVENOUS | Status: DC | PRN
  Administered 2024-02-15: 30 ug/min via INTRAVENOUS

## 2024-02-15 MED ORDER — DIPHENHYDRAMINE HCL 12.5 MG/5ML PO ELIX
12.5000 mg | ORAL_SOLUTION | ORAL | Status: DC | PRN
  Administered 2024-02-15: 25 mg via ORAL
  Filled 2024-02-15: qty 10

## 2024-02-15 MED ORDER — CHLORHEXIDINE GLUCONATE 0.12 % MT SOLN
15.0000 mL | Freq: Once | OROMUCOSAL | Status: AC
  Administered 2024-02-15: 15 mL via OROMUCOSAL

## 2024-02-15 MED ORDER — LACTATED RINGERS IV SOLN
INTRAVENOUS | Status: DC
Start: 2024-02-15 — End: 2024-02-15

## 2024-02-15 MED ORDER — LORATADINE 10 MG PO TABS
10.0000 mg | ORAL_TABLET | Freq: Every evening | ORAL | Status: DC
  Administered 2024-02-15: 10 mg via ORAL
  Filled 2024-02-15: qty 1

## 2024-02-15 MED ORDER — HYDROMORPHONE HCL 1 MG/ML IJ SOLN: 0.2500 mg | INTRAMUSCULAR | Status: DC | PRN

## 2024-02-15 MED ORDER — BUPIVACAINE IN DEXTROSE 0.75-8.25 % IT SOLN
INTRATHECAL | Status: DC | PRN
  Administered 2024-02-15: 1.6 mL via INTRATHECAL

## 2024-02-15 MED ORDER — 0.9 % SODIUM CHLORIDE (POUR BTL) OPTIME
TOPICAL | Status: DC | PRN
Start: 2024-02-15 — End: 2024-02-15
  Administered 2024-02-15: 1000 mL

## 2024-02-15 MED ORDER — PROPOFOL 500 MG/50ML IV EMUL
INTRAVENOUS | Status: DC | PRN
  Administered 2024-02-15: 75 ug/kg/min via INTRAVENOUS

## 2024-02-15 MED ORDER — CEFAZOLIN SODIUM-DEXTROSE 2-4 GM/100ML-% IV SOLN
2.0000 g | INTRAVENOUS | Status: AC
  Administered 2024-02-15: 2 g via INTRAVENOUS
  Filled 2024-02-15: qty 100

## 2024-02-15 MED ORDER — PHENYLEPHRINE 80 MCG/ML (10ML) SYRINGE FOR IV PUSH (FOR BLOOD PRESSURE SUPPORT)
PREFILLED_SYRINGE | INTRAVENOUS | Status: AC
  Filled 2024-02-15: qty 10

## 2024-02-15 MED ORDER — POLYETHYLENE GLYCOL 3350 17 G PO PACK: 17.0000 g | PACK | Freq: Every day | ORAL | Status: DC | PRN

## 2024-02-15 MED ORDER — HYDROCODONE-ACETAMINOPHEN 7.5-325 MG PO TABS
1.0000 | ORAL_TABLET | ORAL | Status: DC | PRN
  Administered 2024-02-15: 1 via ORAL
  Filled 2024-02-15: qty 1

## 2024-02-15 MED ORDER — MIRABEGRON ER 25 MG PO TB24
50.0000 mg | ORAL_TABLET | Freq: Every evening | ORAL | Status: DC
  Administered 2024-02-15: 50 mg via ORAL
  Filled 2024-02-15 (×2): qty 2

## 2024-02-15 MED ORDER — METHOCARBAMOL 1000 MG/10ML IJ SOLN: 500.0000 mg | Freq: Four times a day (QID) | INTRAMUSCULAR | Status: DC | PRN

## 2024-02-15 MED ORDER — HYDROCHLOROTHIAZIDE 25 MG PO TABS
25.0000 mg | ORAL_TABLET | Freq: Every day | ORAL | Status: DC
  Administered 2024-02-16: 25 mg via ORAL
  Filled 2024-02-15: qty 1

## 2024-02-15 MED ORDER — ALUM & MAG HYDROXIDE-SIMETH 200-200-20 MG/5ML PO SUSP: 30.0000 mL | ORAL | Status: DC | PRN

## 2024-02-15 MED ORDER — TRANEXAMIC ACID-NACL 1000-0.7 MG/100ML-% IV SOLN
1000.0000 mg | INTRAVENOUS | Status: AC
  Administered 2024-02-15: 1000 mg via INTRAVENOUS
  Filled 2024-02-15: qty 100

## 2024-02-15 MED ORDER — EPHEDRINE 5 MG/ML INJ
INTRAVENOUS | Status: AC
  Filled 2024-02-15: qty 5

## 2024-02-15 SURGICAL SUPPLY — 36 items
BAG COUNTER SPONGE SURGICOUNT (BAG) ×2 IMPLANT
BAG ZIPLOCK 12X15 (MISCELLANEOUS) IMPLANT
BENZOIN TINCTURE PRP APPL 2/3 (GAUZE/BANDAGES/DRESSINGS) IMPLANT
BLADE SAW SGTL 18X1.27X75 (BLADE) ×2 IMPLANT
COVER PERINEAL POST (MISCELLANEOUS) ×2 IMPLANT
COVER SURGICAL LIGHT HANDLE (MISCELLANEOUS) ×2 IMPLANT
DRAPE FOOT SWITCH (DRAPES) ×2 IMPLANT
DRAPE STERI IOBAN 125X83 (DRAPES) ×2 IMPLANT
DRAPE U-SHAPE 47X51 STRL (DRAPES) ×4 IMPLANT
DRSG AQUACEL AG ADV 3.5X10 (GAUZE/BANDAGES/DRESSINGS) ×2 IMPLANT
DURAPREP 26ML APPLICATOR (WOUND CARE) ×2 IMPLANT
ELECT PENCIL ROCKER SW 15FT (MISCELLANEOUS) ×2 IMPLANT
ELECT REM PT RETURN 15FT ADLT (MISCELLANEOUS) ×2 IMPLANT
GAUZE XEROFORM 1X8 LF (GAUZE/BANDAGES/DRESSINGS) IMPLANT
GLOVE BIO SURGEON STRL SZ7.5 (GLOVE) ×2 IMPLANT
GLOVE BIOGEL PI IND STRL 8 (GLOVE) ×4 IMPLANT
GLOVE ECLIPSE 8.0 STRL XLNG CF (GLOVE) ×2 IMPLANT
GOWN STRL REUS W/ TWL XL LVL3 (GOWN DISPOSABLE) ×4 IMPLANT
HEAD M SROM 36MM 2 (Hips) IMPLANT
HOLDER FOLEY CATH W/STRAP (MISCELLANEOUS) ×2 IMPLANT
KIT TURNOVER KIT A (KITS) IMPLANT
LINER NEUTRAL 52X36MM PLUS 4 (Liner) IMPLANT
PACK ANTERIOR HIP CUSTOM (KITS) ×2 IMPLANT
PIN SECTOR W/GRIP ACE CUP 52MM (Hips) IMPLANT
SET HNDPC FAN SPRY TIP SCT (DISPOSABLE) ×2 IMPLANT
STAPLER SKIN PROX WIDE 3.9 (STAPLE) IMPLANT
STEM FEMORAL SZ6 HIGH ACTIS (Stem) IMPLANT
STRIP CLOSURE SKIN 1/2X4 (GAUZE/BANDAGES/DRESSINGS) IMPLANT
SUT ETHIBOND NAB CT1 #1 30IN (SUTURE) ×2 IMPLANT
SUT ETHILON 2 0 PS N (SUTURE) IMPLANT
SUT MNCRL AB 4-0 PS2 18 (SUTURE) IMPLANT
SUT VIC AB 0 CT1 36 (SUTURE) ×2 IMPLANT
SUT VIC AB 1 CT1 36 (SUTURE) ×2 IMPLANT
SUT VIC AB 2-0 CT1 TAPERPNT 27 (SUTURE) ×4 IMPLANT
TRAY FOLEY MTR SLVR 14FR STAT (SET/KITS/TRAYS/PACK) IMPLANT
YANKAUER SUCT BULB TIP NO VENT (SUCTIONS) ×2 IMPLANT

## 2024-02-15 NOTE — Anesthesia Procedure Notes (Signed)
 Spinal  Patient location during procedure: OR Start time: 02/15/2024 1:20 PM End time: 02/15/2024 1:23 PM Staffing Performed: resident/CRNA  Resident/CRNA: Rochell Chroman, CRNA Performed by: Rochell Chroman, CRNA Authorized by: Earvin Goldberg, MD   Preanesthetic Checklist Completed: patient identified, IV checked, site marked, risks and benefits discussed, surgical consent, monitors and equipment checked, pre-op evaluation and timeout performed Spinal Block Patient position: sitting Prep: DuraPrep Patient monitoring: heart rate, blood pressure, continuous pulse ox and cardiac monitor Approach: midline Location: L3-4 Needle Needle type: Pencan  Needle gauge: 24 G Needle length: 10 cm Needle insertion depth: 8 cm Assessment Events: CSF return

## 2024-02-15 NOTE — Transfer of Care (Signed)
 Immediate Anesthesia Transfer of Care Note  Patient: Rhonda Blair  Procedure(s) Performed: ARTHROPLASTY, HIP, TOTAL, ANTERIOR APPROACH (Left: Hip)  Patient Location: PACU  Anesthesia Type:Spinal  Level of Consciousness: awake and patient cooperative  Airway & Oxygen Therapy: Patient Spontanous Breathing and Patient connected to face mask  Post-op Assessment: Report given to RN and Post -op Vital signs reviewed and stable  Post vital signs: Reviewed and stable  Last Vitals:  Vitals Value Taken Time  BP 109/55 02/15/24 1501  Temp    Pulse 62 02/15/24 1503  Resp 16 02/15/24 1503  SpO2 96 % 02/15/24 1503  Vitals shown include unfiled device data.  Last Pain:  Vitals:   02/15/24 1221  TempSrc:   PainSc: 0-No pain         Complications: No notable events documented.

## 2024-02-15 NOTE — Interval H&P Note (Signed)
 History and Physical Interval Note: The patient understands that she is here today for a left total hip replacement to treat her significant left hip pain and arthritis.  There has been no acute or interval change in her medical status.  The risks and benefits of surgery have been discussed in detail and informed consent has been obtained.  The left operative hip has been marked.  02/15/2024 12:21 PM  Rhonda Blair  has presented today for surgery, with the diagnosis of osteoarthritis left hip.  The various methods of treatment have been discussed with the patient and family. After consideration of risks, benefits and other options for treatment, the patient has consented to  Procedure(s): ARTHROPLASTY, HIP, TOTAL, ANTERIOR APPROACH (Left) as a surgical intervention.  The patient's history has been reviewed, patient examined, no change in status, stable for surgery.  I have reviewed the patient's chart and labs.  Questions were answered to the patient's satisfaction.     Arnie Lao

## 2024-02-15 NOTE — Anesthesia Preprocedure Evaluation (Signed)
 Anesthesia Evaluation  Patient identified by MRN, date of birth, ID band Patient awake    Reviewed: Allergy & Precautions, H&P , NPO status , Patient's Chart, lab work & pertinent test results  Airway Mallampati: II  TM Distance: >3 FB Neck ROM: Full    Dental no notable dental hx.    Pulmonary neg pulmonary ROS   Pulmonary exam normal breath sounds clear to auscultation       Cardiovascular hypertension, Pt. on medications negative cardio ROS Normal cardiovascular exam Rhythm:Regular Rate:Normal     Neuro/Psych   Anxiety Depression    negative neurological ROS  negative psych ROS   GI/Hepatic Neg liver ROS,GERD  ,,  Endo/Other  Hypothyroidism    Renal/GU negative Renal ROS  negative genitourinary   Musculoskeletal  (+) Arthritis , Osteoarthritis,    Abdominal   Peds negative pediatric ROS (+)  Hematology negative hematology ROS (+)   Anesthesia Other Findings   Reproductive/Obstetrics negative OB ROS                             Anesthesia Physical Anesthesia Plan  ASA: 2  Anesthesia Plan: Spinal   Post-op Pain Management:    Induction: Intravenous  PONV Risk Score and Plan: 2 and Ondansetron , Propofol infusion and Treatment may vary due to age or medical condition  Airway Management Planned: Simple Face Mask  Additional Equipment:   Intra-op Plan:   Post-operative Plan:   Informed Consent: I have reviewed the patients History and Physical, chart, labs and discussed the procedure including the risks, benefits and alternatives for the proposed anesthesia with the patient or authorized representative who has indicated his/her understanding and acceptance.     Dental advisory given  Plan Discussed with: CRNA  Anesthesia Plan Comments:        Anesthesia Quick Evaluation

## 2024-02-15 NOTE — Op Note (Signed)
 Operative Note  Date of operation: 02/15/2024 Preoperative diagnosis: Left hip primary osteoarthritis Postoperative diagnosis: Same  Procedure: Left direct anterior total hip arthroplasty  Implants: Implant Name Type Inv. Item Serial No. Manufacturer Lot No. LRB No. Used Action  PIN SECTOR W/GRIP ACE CUP - [id number] Hips PIN SECTOR W/GRIP ACE CUP  [employer] [id number] Left 1 Implanted  LINER NEUTRAL 52X36MM PLUS 4 - [id number] Liner LINER NEUTRAL [id number] PLUS 4  DEPU[employer]TH[employer] [id number] Left 1 Implanted  STEM FEMORAL SZ6 HIGH ACTIS - [id number] Stem STEM FEMORAL SZ6 HIGH ACTIS  [employer]PU[employer]TH[employer] [id number] Left 1 Implanted  HEAD M SROM 2 - [id number] Hips HEAD M SROM 2  DEPU[employer] [id number] Left 1 Implanted   Surgeon: Jeanella Milan. Lucienne Ryder, MD Assistant: Malena Scull, PA-C  Anesthesia: Spinal EBL: 100 to 150 cc Antibiotics: IV Ancef  Complications: None  Indications: The patient is an active 77 year old female with debilitating arthritis involving her left hip.  She has tried and failed conservative treatment for over a year.  At this point her left hip pain is daily and it is detrimentally fighting her mobility, her quality of life and actives daily living to the point she does wish to proceed with a total hip arthroplasty on the left side and we agree with this as well.  We discussed the risks of acute blood loss anemia, nerve vessel injury, fracture, infection, DVT, dislocation, implant failure, leg length differences and wound healing issues.  She understands that our goals are hopefully decreased pain, improved mobility and improved quality of life.  Procedure description: After informed consent was obtained and appropriate left hip was marked, the patient was brought to the operating room and set up on the stretcher where spinal anesthesia was obtained.  She was then laid in supine position on stretcher and a Foley catheter was placed.  Traction boots  were placed on both her feet and next she was placed supine on the Hana fracture table with a perineal post in place in both legs in inline skeletal traction devices but no traction applied.  Her left operative hip and pelvis were assessed radiographically.  The left hip was prepped and draped with DuraPrep and sterile drapes.  A timeout was called and she was identified as the correct patient the correct left hip.  An incision was then made just inferior and posterior to the ASIS and carried slightly obliquely down the leg.  Dissection was carried down to the tensor fascia lata muscle and the tensor fascia was divided longitudinally to proceed with a direct interposed the hip.  Circumflex vessels were identified and cauterized.  The was identified and opened up in L-type format finding a moderate joint effusion.  Cobra retractors were placed around the medial and lateral femoral neck and a femoral neck cut was made with an oscillating saw and completed with an osteotome.  A corkscrew guide was placed in the femoral head and the femoral head was removed in its entirety and there was a wide area devoid of cartilage.  A bent Hohmann was then placed over the medial sterile rim and remnants of the acetabular labrum and other debris removed.  Reaming was then initiated from a size 43 reamer and stepwise increments going up to a size 51 reamer with all reamers placed under direct visualization and the last reamer also placed under direct fluoroscopy in order to obtain the depth of reaming, the inclination and anteversion.  The real DePuy sector GRIPTION acetabular component  size 52 was then placed without difficulty followed by a 36+4 polythene liner.  Attention was then turned the femur.  With the left leg externally rotated to 120 degrees, extended and abducted, a Mueller retractors were placed medially and Hohmann tractor behind the greater trochanter.  The lateral joint capsule was released and a box cutting osteotome  was used into the femoral canal.  Broaching was then initiated using the Actis broaching system from a size 0 going in stepwise increments up to a size 6.  With a size 6 in place we trialed a standard offset femoral neck and a 36-2 trial hip ball.  The left leg was brought over and up and with traction and internal rotation reduced in the pelvis.  Based on radiographic assessment we felt like we needed more offset.  The hip was dislocated and the trial components were removed.  We placed the real Actis femoral component with high offset size 6 and the real 36-2 metal hip ball.  We reduce this in acetabulum and we felt good about stability as well as offset and range of motion.  She has slightly longer leg length wise but overall we feel like it is stable and appropriate.  The soft tissue was then irrigated with normal saline solution.  The joint capsule was closed with interrupted #1 Ethibond suture followed by #1 Vicryl close the tensor fascia.  0 Vicryl was used to close the deep tissue and 2-0 Vicryl was used to close subcutaneous tissue.  The skin was closed with staples.  An Aquacel dressing was applied.  The patient was taken off the Hana table and taken the recovery room.  Malena Scull, PA-C did assist in entire case from beginning to end and assistance was crucial and medically necessary for soft tissue management and retraction, helping guide implant placement and a layered closure of the wound.

## 2024-02-15 NOTE — Anesthesia Postprocedure Evaluation (Signed)
 Anesthesia Post Note  Patient: Rhonda Blair  Procedure(s) Performed: ARTHROPLASTY, HIP, TOTAL, ANTERIOR APPROACH (Left: Hip)     Patient location during evaluation: PACU Anesthesia Type: Spinal Level of consciousness: awake and alert Pain management: pain level controlled Vital Signs Assessment: post-procedure vital signs reviewed and stable Respiratory status: spontaneous breathing, nonlabored ventilation and respiratory function stable Cardiovascular status: blood pressure returned to baseline and stable Postop Assessment: no apparent nausea or vomiting Anesthetic complications: no   No notable events documented.  Last Vitals:  Vitals:   02/15/24 1615 02/15/24 1631  BP: 132/70 (!) 145/66  Pulse: 62 60  Resp: 16 18  Temp:    SpO2: 94% 98%    Last Pain:  Vitals:   02/15/24 1545  TempSrc:   PainSc: 0-No pain                 Earvin Goldberg

## 2024-02-16 DIAGNOSIS — M1612 Unilateral primary osteoarthritis, left hip: Secondary | ICD-10-CM | POA: Diagnosis not present

## 2024-02-16 LAB — CBC
HCT: 33.5 % — ABNORMAL LOW (ref 36.0–46.0)
Hemoglobin: 11.3 g/dL — ABNORMAL LOW (ref 12.0–15.0)
MCH: 31.3 pg (ref 26.0–34.0)
MCHC: 33.7 g/dL (ref 30.0–36.0)
MCV: 92.8 fL (ref 80.0–100.0)
Platelets: 233 10*3/uL (ref 150–400)
RBC: 3.61 MIL/uL — ABNORMAL LOW (ref 3.87–5.11)
RDW: 12.7 % (ref 11.5–15.5)
WBC: 8.3 10*3/uL (ref 4.0–10.5)
nRBC: 0 % (ref 0.0–0.2)

## 2024-02-16 MED ORDER — HYDROCODONE-ACETAMINOPHEN 5-325 MG PO TABS
1.0000 | ORAL_TABLET | Freq: Four times a day (QID) | ORAL | 0 refills | Status: AC | PRN
Start: 2024-02-16 — End: ?

## 2024-02-16 MED ORDER — METHOCARBAMOL 500 MG PO TABS: 500.0000 mg | ORAL_TABLET | Freq: Four times a day (QID) | ORAL | 1 refills | Status: AC | PRN

## 2024-02-16 MED ORDER — ASPIRIN 81 MG PO CHEW: 81.0000 mg | CHEWABLE_TABLET | Freq: Two times a day (BID) | ORAL | 0 refills | Status: AC

## 2024-02-16 NOTE — Care Management Obs Status (Signed)
 MEDICARE OBSERVATION STATUS NOTIFICATION   Patient Details  Name: Rhonda Blair MRN: [id number] Date of Birth: 10/20/1945   Medicare Observation Status Notification Given:  Yes    Amaryllis Junior, LCSW 02/16/2024, 10:33 AM

## 2024-02-16 NOTE — Discharge Summary (Signed)
 Patient ID: Rhonda Blair MRN: [id number] DOB/AGE: 12/20/1945 78 y.o.  Admit date: 02/15/2024 Discharge date: 02/16/2024  Admission Diagnoses:  Principal Problem:   Unilateral primary osteoarthritis, left hip Active Problems:   Status post total replacement of left hip   Discharge Diagnoses:  Same  Past Medical History:  Diagnosis Date   Anxiety    Arthritis    Depression    GERD (gastroesophageal reflux disease)    Hypertension    Hypothyroidism    Pre-diabetes    Thyroid  disease     Surgeries: Procedure(s): ARTHROPLASTY, HIP, TOTAL, ANTERIOR APPROACH on 02/15/2024   Consultants:   Discharged Condition: Improved  Hospital Course: [name] is an 78 y.o. female who was admitted 02/15/2024 for operative treatment ofUnilateral primary osteoarthritis, left hip. Patient has severe unremitting pain that affects sleep, daily activities, and work/hobbies. After pre-op clearance the patient was taken to the operating room on 02/15/2024 and underwent  Procedure(s): ARTHROPLASTY, HIP, TOTAL, ANTERIOR APPROACH.    Patient was given perioperative antibiotics:  Anti-infectives (From admission, onward)    Start     Dose/Rate Route Frequency Ordered Stop   02/15/24 2000  ceFAZolin  (ANCEF ) IVPB 2g/100 mL premix        2 g 200 mL/hr over 30 Minutes Intravenous Every 6 hours 02/15/24 1632 02/16/24 1025   02/15/24 1145  ceFAZolin  (ANCEF ) IVPB 2g/100 mL premix        2 g 200 mL/hr over 30 Minutes Intravenous On call to O.R. 02/15/24 1131 02/15/24 1333        Patient was given sequential compression devices, early ambulation, and chemoprophylaxis to prevent DVT.  Patient benefited maximally from hospital stay and there were no complications.    Recent vital signs: Patient Vitals for the past 24 hrs:  BP Temp Temp src Pulse Resp SpO2  02/16/24 1405 125/60 98.3 F (36.8 C) -- 80 16 92 %  02/16/24 0949 134/64 98.6 F (37 C) -- 76 16 92 %  02/16/24 0454 (!) 118/56 98.8 F (37.1  C) Oral 81 18 93 %  02/16/24 0105 121/61 98.2 F (36.8 C) Oral 77 18 92 %  02/15/24 2031 130/69 97.7 F (36.5 C) Oral 82 18 94 %  02/15/24 1631 (!) 145/66 -- -- 60 18 98 %  02/15/24 1615 132/70 -- -- 62 16 94 %     Recent laboratory studies:  Recent Labs    02/16/24 0331  WBC 8.3  HGB 11.3*  HCT 33.5*  PLT 233     Discharge Medications:   Allergies as of 02/16/2024       Reactions   Lisinopril Cough, Other (See Comments)   Oxycodone Nausea And Vomiting, Other (See Comments)   Pneumococcal Vaccines Other (See Comments)   Swelling/hot at the injection site (loss of adipose tissue)        Medication List     TAKE these medications    acetaminophen  650 MG CR tablet Commonly known as: TYLENOL  Take 1,300 mg by mouth every 8 (eight) hours.   amLODipine 5 MG tablet Commonly known as: NORVASC Take 5 mg by mouth in the morning.   aspirin 81 MG chewable tablet Chew 1 tablet (81 mg total) by mouth 2 (two) times daily.   busPIRone  10 MG tablet Commonly known as: BUSPAR  Take 10 mg by mouth See admin instructions. Take 1 tablet (10 mg) by mouth scheduled every morning & may take an additional 2 doses (10 mg) by mouth if needed for anxiety.  chlorhexidine 4 % external liquid Commonly known as: HIBICLENS Apply 15 mLs (1 Application total) topically as directed for 30 doses. Use as directed daily for 5 days every other week for 6 weeks.   citalopram  40 MG tablet Commonly known as: CELEXA  Take 40 mg by mouth every evening.   famotidine  40 MG tablet Commonly known as: PEPCID  Take 40 mg by mouth every evening.   fluticasone  50 MCG/ACT nasal spray Commonly known as: FLONASE  Place 2 sprays into both nostrils in the morning.   GLUCOSAMINE CHONDROITIN COMPLX PO Take 1 capsule by mouth in the morning and at bedtime.   hydrochlorothiazide 25 MG tablet Commonly known as: HYDRODIURIL Take 25 mg by mouth in the morning.   HYDROcodone -acetaminophen  5-325 MG  tablet Commonly known as: NORCO/VICODIN Take 1-2 tablets by mouth every 6 (six) hours as needed for moderate pain (pain score 4-6).   hydroxypropyl methylcellulose / hypromellose 2.5 % ophthalmic solution Commonly known as: ISOPTO TEARS / GONIOVISC Place 1-2 drops into both eyes 3 (three) times daily as needed (dry/irritated eyes.).   levothyroxine 50 MCG tablet Commonly known as: SYNTHROID Take 50 mcg by mouth daily before breakfast.   loratadine  10 MG tablet Commonly known as: CLARITIN  Take 10 mg by mouth every evening.   losartan 100 MG tablet Commonly known as: COZAAR Take 100 mg by mouth in the morning.   methocarbamol  500 MG tablet Commonly known as: ROBAXIN  Take 1 tablet (500 mg total) by mouth every 6 (six) hours as needed for muscle spasms.   multivitamin with minerals Tabs tablet Take 1 tablet by mouth in the morning.   mupirocin ointment 2 % Commonly known as: BACTROBAN Place 1 Application into the nose 2 (two) times daily for 60 doses. Use as directed 2 times daily for 5 days every other week for 6 weeks.   Myrbetriq  50 MG Tb24 tablet Generic drug: mirabegron  ER Take 50 mg by mouth every evening.   OYSTER SHELL CALCIUM/D3 PO Take 1-2 tablets by mouth See admin instructions. Take 1 tablet by mouth in the morning & take 2 tablets in the evening.   rosuvastatin 10 MG tablet Commonly known as: CRESTOR Take 10 mg by mouth every Tuesday, Thursday, Saturday, and Sunday at 6 PM. Sundays, Tuesdays, Thursdays & Saturdays in the evening.   Salonpas 3.10-21-08 % Ptch Generic drug: Camphor-Menthol-Methyl Sal Place 1 patch onto the skin daily as needed (pain.).   VITAMIN C PO Take 1 tablet by mouth in the morning.               Durable Medical Equipment  (From admission, onward)           Start     Ordered   02/15/24 1633  DME 3 n 1  Once        02/15/24 1632   02/15/24 1633  DME Walker rolling  Once       Question Answer Comment  Walker: With 5 Inch  Wheels   Patient needs a walker to treat with the following condition Status post total replacement of left hip      05 /02/25 1632            Diagnostic Studies: DG Pelvis Portable Result Date: 02/15/2024 CLINICAL DATA:  Status post total left hip arthroplasty. EXAM: PORTABLE PELVIS 1-2 VIEWS COMPARISON:  Pelvis and left hip radiographs 01/16/2024 FINDINGS: Interval total left hip arthroplasty. No perihardware lucency is seen to indicate hardware failure or loosening. Unchanged moderate to severe superomedial right  femoroacetabular joint space narrowing. Mild pubic symphysis joint space narrowing and peripheral osteophytosis. Mild-to-moderate bilateral sacroiliac subchondral sclerosis and right greater than left joint space narrowing. Moderate L4-5 disc space narrowing. Expected postoperative changes of left hip subcutaneous and intra-articular air. Lateral left hip surgical skin staples. IMPRESSION: Interval total left hip arthroplasty without evidence of hardware failure. Electronically Signed   By: Bertina Broccoli M.D.   On: 02/15/2024 15:27   DG HIP UNILAT WITH PELVIS 1V LEFT Result Date: 02/15/2024 CLINICAL DATA:  Intraoperative fluoroscopy for total left hip arthroplasty. EXAM: DG HIP (WITH OR WITHOUT PELVIS) 1V*L* COMPARISON:  Pelvis and left hip radiographs 01/16/2024 FINDINGS: Images were performed intraoperatively without the presence of a radiologist. The patient is undergoing total left hip arthroplasty. No hardware complication is seen. Total fluoroscopy images: 2 Total fluoroscopy time: 18 seconds Total dose: Radiation Exposure Index (as provided by the fluoroscopic device): 1.96 mGy air Kerma Please see intraoperative findings for further detail. IMPRESSION: Intraoperative fluoroscopy for total left hip arthroplasty. Electronically Signed   By: Bertina Broccoli M.D.   On: 02/15/2024 15:26   DG C-Arm 1-60 Min-No Report Result Date: 02/15/2024 Fluoroscopy was utilized by the requesting  physician.  No radiographic interpretation.   DG C-Arm 1-60 Min-No Report Result Date: 02/15/2024 Fluoroscopy was utilized by the requesting physician.  No radiographic interpretation.    Disposition: Discharge disposition: 01-Home or Self Care          Follow-up Information     Arnie Lao, MD Follow up in 2 week(s).   Specialty: Orthopedic Surgery Contact information: [address] [place] [address] [phone]                  Signed: Arnie Lao 02/16/2024, 4:05 PM

## 2024-02-16 NOTE — TOC Transition Note (Signed)
 Transition of Care [facility]) - Discharge Note   Patient Details  Name: Rhonda Blair MRN: [id number] Date of Birth: February 06, 1946  [facility] CM/SW Contact:  Amaryllis Junior, LCSW Phone Number: 02/16/2024, 12:51 PM   Clinical Narrative:    Pt medically ready to dc home. Pt has RW at home. Therapy plan is HHPT with [facility]. No further TOC needs.    Final next level of care: Home w Home Health Services Barriers to Discharge: Barriers Resolved   Patient Goals and CMS Choice Patient states their goals for this hospitalization and ongoing recovery are:: return home CMS Medicare.gov Compare Post Acute Care list provided to::  (NA) Choice offered to / list presented to : NA [place] ownership interest in [facility].provided to::  (NA)    Discharge Placement                    Patient and family notified of of transfer: 02/16/24  Discharge Plan and Services Additional resources added to the After Visit Summary for                  DME Arranged: N/A DME Agency: NA                  Social Drivers of Health (SDOH) Interventions SDOH Screenings   Food Insecurity: No Food Insecurity (02/15/2024)  Housing: Low Risk  (02/15/2024)  Transportation Needs: No Transportation Needs (02/15/2024)  Utilities: Not At Risk (02/15/2024)  Social Connections: Socially Integrated (02/15/2024)  Tobacco Use: Unknown (02/15/2024)     Readmission Risk Interventions     No data to display

## 2024-02-16 NOTE — Progress Notes (Signed)
 Subjective: 1 Day Post-Op Procedure(s) (LRB): ARTHROPLASTY, HIP, TOTAL, ANTERIOR APPROACH (Left) Patient reports pain as moderate.    Objective: Vital signs in last 24 hours: Temp:  [97.4 F (36.3 C)-98.8 F (37.1 C)] 98.6 F (37 C) (05/03 0949) Pulse Rate:  [56-82] 76 (05/03 0949) Resp:  [12-18] 16 (05/03 0949) BP: (101-145)/(55-72) 134/64 (05/03 0949) SpO2:  [92 %-100 %] 92 % (05/03 0949) Weight:  [64 kg] 64 kg (05/02 1221)  Intake/Output from previous day: 05/02 0701 - 05/03 0700 In: 2465 [P.O.:840; I.V.:1325; IV Piggyback:300] Out: 1225 [Urine:1125; Blood:100] Intake/Output this shift: Total I/O In: 480 [P.O.:480] Out: -   Recent Labs    02/16/24 0331  HGB 11.3*   Recent Labs    02/16/24 0331  WBC 8.3  RBC 3.61*  HCT 33.5*  PLT 233   No results for input(s): "NA", "K", "CL", "CO2", "BUN", "CREATININE", "GLUCOSE", "CALCIUM" in the last 72 hours. No results for input(s): "LABPT", "INR" in the last 72 hours.  Sensation intact distally Intact pulses distally Dorsiflexion/Plantar flexion intact Incision: dressing C/D/I   Assessment/Plan: 1 Day Post-Op Procedure(s) (LRB): ARTHROPLASTY, HIP, TOTAL, ANTERIOR APPROACH (Left) Up with therapy Discharge home with home health this afternoon.      Arnie Lao 02/16/2024, 10:23 AM

## 2024-02-16 NOTE — Plan of Care (Signed)
 Patient discharged via private vehicle with husband. AVS and discharge instruction provided. Patient verbalizes understanding. Ara Knee, RN 02/16/24 3:38 PM

## 2024-02-16 NOTE — Discharge Instructions (Signed)

## 2024-02-16 NOTE — Progress Notes (Signed)
 Physical Therapy Treatment Patient Details Name: Rhonda Blair MRN: [id number] DOB: 1946-07-22 Today's Date: 02/16/2024   History of Present Illness Pt is a 78 year old female s/p L THA on 02/15/24.    PT Comments  Pt ambulated short distance in hallway and practiced safe step technique.  Pt still with difficulty advancing Lt LE and had gait belt in place to self assist with ambulating on Therapist's arrival to room, so discussed safety and fall risk with gait belt around foot during standing/walking activities, and pt reports understanding.  Pt reviewed HEP handout (supine and sitting exercises) and therapist demonstrated standing exercises (to start in 2-3 days) or with HHPT.  Spouse present at time of session and to assist pt at home (also provided a gait belt (for waist) for safety with mobilizing).  Pt prefers to d/c home today and will have spouse assist.  Pt had no further questions.    If plan is discharge home, recommend the following: Help with stairs or ramp for entrance;Assist for transportation   Can travel by private vehicle        Equipment Recommendations  None recommended by PT    Recommendations for Other Services       Precautions / Restrictions Precautions Precautions: Fall Restrictions Weight Bearing Restrictions Per Provider Order: No Other Position/Activity Restrictions: WBAT     Mobility  Bed Mobility               General bed mobility comments: pt ambulating into bathroom with spouse on arrival to room (was going to brush her teeth)    Transfers Overall transfer level: Needs assistance Equipment used: Rolling walker (2 wheels) Transfers: Sit to/from Stand Sit to Stand: Supervision, Contact guard assist           General transfer comment: verbal cues for UE and LE positioning for pain control    Ambulation/Gait Ambulation/Gait assistance: Contact guard assist, Supervision Gait Distance (Feet): 40 Feet Assistive device: Rolling walker  (2 wheels) Gait Pattern/deviations: Step-to pattern, Decreased stance time - left, Antalgic, Trunk flexed Gait velocity: decr     General Gait Details: verbal cues for sequence, step length, posture, RW positioning; pt with difficulty advancing left LE due to decreased hip flexion so also compensating with forward trunk lean; pt utilizing gait belt around Lt foot to assist with advancing (already in place on arrival to room) so discussed safety and caution with this if performing at home due to increase fall risk   Stairs Stairs: Yes Stairs assistance: Contact guard assist Stair Management: Forwards, Step to pattern, With walker Number of Stairs: 1 General stair comments: verbal cues for technique, sequence and safety; pt reports understanding   Wheelchair Mobility     Tilt Bed    Modified Rankin (Stroke Patients Only)       Balance                                            Communication Communication Communication: No apparent difficulties  Cognition Arousal: Alert Behavior During Therapy: WFL for tasks assessed/performed   PT - Cognitive impairments: No apparent impairments                         Following commands: Intact      Cueing    Exercises Total Joint Exercises Ankle Circles/Pumps: AROM, Both, 10 reps  Quad Sets: AROM, Both, 10 reps Heel Slides: AAROM, Left, 10 reps Hip ABduction/ADduction: AAROM, Left, 10 reps Long Arc Quad: AROM, Left, 10 reps, Seated    General Comments        Pertinent Vitals/Pain Pain Assessment Pain Assessment: 0-10 Pain Score: 6  Pain Location: left hip Pain Descriptors / Indicators: Aching, Sore, Grimacing, Guarding Pain Intervention(s): Monitored during session, Repositioned, Premedicated before session    Home Living Family/patient expects to be discharged to:: Private residence Living Arrangements: Spouse/significant other Available Help at Discharge: Family Type of Home: House Home  Access: Stairs to enter   Secretary/administrator of Steps: 1   Home Layout: One level Home Equipment: Agricultural consultant (2 wheels)      Prior Function            PT Goals (current goals can now be found in the care plan section) Acute Rehab PT Goals PT Goal Formulation: With patient Time For Goal Achievement: 02/23/24 Potential to Achieve Goals: Good Progress towards PT goals: Progressing toward goals    Frequency    7X/week      PT Plan      Co-evaluation              AM-PAC PT "6 Clicks" Mobility   Outcome Measure  Help needed turning from your back to your side while in a flat bed without using bedrails?: A Little Help needed moving from lying on your back to sitting on the side of a flat bed without using bedrails?: A Little Help needed moving to and from a bed to a chair (including a wheelchair)?: A Little Help needed standing up from a chair using your arms (e.g., wheelchair or bedside chair)?: A Little Help needed to walk in hospital room?: A Little Help needed climbing 3-5 steps with a railing? : A Little 6 Click Score: 18    End of Session Equipment Utilized During Treatment: Gait belt Activity Tolerance: Patient tolerated treatment well Patient left: in chair;with call bell/phone within reach;with family/visitor present Nurse Communication: Mobility status PT Visit Diagnosis: Difficulty in walking, not elsewhere classified (R26.2)     Time: 1478-2956 PT Time Calculation (min) (ACUTE ONLY): 16 min  Charges:    $Gait Training: 8-22 mins PT General Charges $$ ACUTE PT VISIT: 1 Visit                     Blanch Bunde, DPT Physical Therapist Acute Rehabilitation Services Office: [phone]   Myna Asal Payson 02/16/2024, 4:07 PM

## 2024-02-16 NOTE — Evaluation (Signed)
 Physical Therapy Evaluation Patient Details Name: Rhonda Blair MRN: [id number] DOB: 1946-06-27 Today's Date: 02/16/2024  History of Present Illness  Pt is a 78 year old female s/p L THA on 02/15/24.  Clinical Impression  Pt is s/p THA resulting in the deficits listed below (see PT Problem List).  Pt will benefit from acute skilled PT to increase their independence and safety with mobility to facilitate discharge. Pt dressed in her pajamas from home and sitting in recliner on arrival. Pt with difficulty with left hip flexion today however reports full sensation.  Pt ambulated in hallway and performed LE exercises.  Anticipate pt could d/c home today pending another session.         If plan is discharge home, recommend the following: Help with stairs or ramp for entrance;Assist for transportation   Can travel by private vehicle        Equipment Recommendations None recommended by PT  Recommendations for Other Services       Functional Status Assessment Patient has had a recent decline in their functional status and demonstrates the ability to make significant improvements in function in a reasonable and predictable amount of time.     Precautions / Restrictions Precautions Precautions: Fall Restrictions Weight Bearing Restrictions Per Provider Order: No Other Position/Activity Restrictions: WBAT      Mobility  Bed Mobility               General bed mobility comments: pt in recliner    Transfers Overall transfer level: Needs assistance Equipment used: Rolling walker (2 wheels) Transfers: Sit to/from Stand Sit to Stand: Contact guard assist           General transfer comment: verbal cues for UE and LE positioning for pain control    Ambulation/Gait Ambulation/Gait assistance: Contact guard assist Gait Distance (Feet): 40 Feet Assistive device: Rolling walker (2 wheels) Gait Pattern/deviations: Step-to pattern, Decreased stance time - left, Antalgic, Trunk  flexed Gait velocity: decr     General Gait Details: verbal cues for sequence, step length, posture, RW positioning; pt with difficulty advancing left LE due to decreased hip flexion so also compensating with forward trunk lean  Stairs            Wheelchair Mobility     Tilt Bed    Modified Rankin (Stroke Patients Only)       Balance                                             Pertinent Vitals/Pain Pain Assessment Pain Assessment: 0-10 Pain Score: 5  Pain Location: left hip Pain Descriptors / Indicators: Aching, Sore, Grimacing, Guarding Pain Intervention(s): Repositioned, Monitored during session, Premedicated before session    Home Living Family/patient expects to be discharged to:: Private residence Living Arrangements: Spouse/significant other Available Help at Discharge: Family Type of Home: House Home Access: Stairs to enter   Secretary/administrator of Steps: 1   Home Layout: One level Home Equipment: Agricultural consultant (2 wheels)      Prior Function Prior Level of Function : Independent/Modified Independent                     Extremity/Trunk Assessment        Lower Extremity Assessment Lower Extremity Assessment: LLE deficits/detail LLE Deficits / Details: anticipated post op hip weakness observed especially hip flexion and abduction,  able to perfor ankle pumps       Communication   Communication Communication: No apparent difficulties    Cognition Arousal: Alert Behavior During Therapy: WFL for tasks assessed/performed   PT - Cognitive impairments: No apparent impairments                         Following commands: Intact       Cueing       General Comments      Exercises Total Joint Exercises Ankle Circles/Pumps: AROM, Both, 10 reps Quad Sets: AROM, Both, 10 reps Heel Slides: AAROM, Left, 10 reps Hip ABduction/ADduction: AAROM, Left, 10 reps Long Arc Quad: AROM, Left, 10 reps, Seated    Assessment/Plan    PT Assessment Patient needs continued PT services  PT Problem List Decreased strength;Decreased activity tolerance;Decreased balance;Decreased mobility;Pain;Decreased range of motion;Decreased knowledge of use of DME       PT Treatment Interventions Stair training;Gait training;DME instruction;Balance training;Functional mobility training;Therapeutic activities;Therapeutic exercise;Patient/family education    PT Goals (Current goals can be found in the Care Plan section)  Acute Rehab PT Goals PT Goal Formulation: With patient Time For Goal Achievement: 02/23/24 Potential to Achieve Goals: Good    Frequency 7X/week     Co-evaluation               AM-PAC PT "6 Clicks" Mobility  Outcome Measure Help needed turning from your back to your side while in a flat bed without using bedrails?: A Little Help needed moving from lying on your back to sitting on the side of a flat bed without using bedrails?: A Little Help needed moving to and from a bed to a chair (including a wheelchair)?: A Little Help needed standing up from a chair using your arms (e.g., wheelchair or bedside chair)?: A Little Help needed to walk in hospital room?: A Little Help needed climbing 3-5 steps with a railing? : A Little 6 Click Score: 18    End of Session Equipment Utilized During Treatment: Gait belt Activity Tolerance: Patient tolerated treatment well Patient left: in chair;with call bell/phone within reach Nurse Communication: Mobility status PT Visit Diagnosis: Difficulty in walking, not elsewhere classified (R26.2)    Time: 4098-1191 PT Time Calculation (min) (ACUTE ONLY): 20 min   Charges:   PT Evaluation $PT Eval Low Complexity: 1 Low   PT General Charges $$ ACUTE PT VISIT: 1 Visit        Rhonda Blair PT, DPT Physical Therapist Acute Rehabilitation Services Office: [phone]   Rhonda Blair 02/16/2024, 1:04 PM

## 2024-02-18 ENCOUNTER — Encounter (HOSPITAL_COMMUNITY): Payer: Self-pay | Admitting: Orthopaedic Surgery

## 2024-02-18 ENCOUNTER — Other Ambulatory Visit: Payer: Self-pay | Admitting: Orthopaedic Surgery

## 2024-02-18 MED ORDER — MUPIROCIN 2 % EX OINT: 1.0000 | TOPICAL_OINTMENT | Freq: Two times a day (BID) | CUTANEOUS | 0 refills | Status: AC

## 2024-02-18 NOTE — Telephone Encounter (Signed)
Patient aware this was sent in for her  

## 2024-02-18 NOTE — Telephone Encounter (Signed)
 Patient called. Says the ointment wasn't called in for her. Does she still need it? Her cb# [phone]

## 2024-02-28 ENCOUNTER — Encounter: Payer: Self-pay | Admitting: Orthopaedic Surgery

## 2024-02-28 ENCOUNTER — Ambulatory Visit: Admitting: Orthopaedic Surgery

## 2024-02-28 DIAGNOSIS — M1612 Unilateral primary osteoarthritis, left hip: Secondary | ICD-10-CM

## 2024-02-28 DIAGNOSIS — Z96642 Presence of left artificial hip joint: Secondary | ICD-10-CM

## 2024-02-28 NOTE — Progress Notes (Signed)
 The patient is an active 78 year old female who is 2 weeks status post a left total hip replacement.  She is doing very well.  She is only taking Tylenol  for pain.  She has been compliant with a baby aspirin  twice daily and ambulating with just a cane.  She has had a history of a knee replacement in the past I think that has helped her prepare for hip replacement.  She has been little bit sore but she is doing a lot of exercises.  Her left hip incision looks good.  Staples removed and Steri-Strips applied.  She does have a moderate seroma and I aspirated about 45 cc of fluid from the surrounding tissue.  She knows this may reaccumulate.  She can stop her baby aspirin  twice daily and can drop from my standpoint.  We will see her back in 4 weeks to see how she is doing overall.  If there are issues before then she knows to let us  know.

## 2024-04-03 ENCOUNTER — Encounter: Payer: Self-pay | Admitting: Orthopaedic Surgery

## 2024-04-03 ENCOUNTER — Ambulatory Visit (INDEPENDENT_AMBULATORY_CARE_PROVIDER_SITE_OTHER): Admitting: Orthopaedic Surgery

## 2024-04-03 DIAGNOSIS — Z96642 Presence of left artificial hip joint: Secondary | ICD-10-CM

## 2024-04-03 NOTE — Progress Notes (Signed)
 The patient is a 79 year old female who is now about 6 weeks status post a left total hip replacement.  She says she is doing great.  She is very active and is walking without assistive device.  She has been dealing with some right-sided low back pain.  Hopeful that will resolve as her posture improves and she gets further out from her hip surgery.  Her right hip moves smoothly and fluidly with no blocks to rotation.  Overall her gait looks good.  Her leg lengths are near equal.  I did show her exercises for her low back in terms of exercises of both facet joints and I want her to do these twice a day with 2 sets in the morning and 2 sets in the evening.  She demonstrated this exercises back to me.  From our standpoint the next time we need to see her is in 6 months for standing AP pelvis and lateral of her right operative hip.  If there are issues before then she knows to reach out and let us  know.  We can always set her up for formal outpatient physical therapy for her back if needed.

## 2024-07-30 ENCOUNTER — Other Ambulatory Visit (HOSPITAL_BASED_OUTPATIENT_CLINIC_OR_DEPARTMENT_OTHER): Payer: Self-pay | Admitting: Obstetrics and Gynecology

## 2024-07-30 DIAGNOSIS — Z8249 Family history of ischemic heart disease and other diseases of the circulatory system: Secondary | ICD-10-CM

## 2024-08-12 ENCOUNTER — Ambulatory Visit (HOSPITAL_BASED_OUTPATIENT_CLINIC_OR_DEPARTMENT_OTHER)
Admission: RE | Admit: 2024-08-12 | Discharge: 2024-08-12 | Disposition: A | Payer: Self-pay | Source: Ambulatory Visit | Attending: Obstetrics and Gynecology | Admitting: Obstetrics and Gynecology

## 2024-08-12 DIAGNOSIS — Z8249 Family history of ischemic heart disease and other diseases of the circulatory system: Secondary | ICD-10-CM

## 2024-08-28 ENCOUNTER — Encounter (HOSPITAL_BASED_OUTPATIENT_CLINIC_OR_DEPARTMENT_OTHER): Payer: Self-pay | Admitting: Nurse Practitioner

## 2024-08-28 ENCOUNTER — Ambulatory Visit (HOSPITAL_BASED_OUTPATIENT_CLINIC_OR_DEPARTMENT_OTHER): Admitting: Nurse Practitioner

## 2024-08-28 VITALS — BP 136/74 | HR 70 | Ht 63.0 in | Wt 145.0 lb

## 2024-08-28 DIAGNOSIS — R931 Abnormal findings on diagnostic imaging of heart and coronary circulation: Secondary | ICD-10-CM

## 2024-08-28 DIAGNOSIS — Z7189 Other specified counseling: Secondary | ICD-10-CM

## 2024-08-28 DIAGNOSIS — K219 Gastro-esophageal reflux disease without esophagitis: Secondary | ICD-10-CM

## 2024-08-28 DIAGNOSIS — I2510 Atherosclerotic heart disease of native coronary artery without angina pectoris: Secondary | ICD-10-CM | POA: Diagnosis not present

## 2024-08-28 DIAGNOSIS — E785 Hyperlipidemia, unspecified: Secondary | ICD-10-CM

## 2024-08-28 DIAGNOSIS — I10 Essential (primary) hypertension: Secondary | ICD-10-CM

## 2024-08-28 MED ORDER — METOPROLOL TARTRATE 100 MG PO TABS: 100.0000 mg | ORAL_TABLET | Freq: Once | ORAL | 0 refills | Status: AC

## 2024-08-28 MED ORDER — ASPIRIN 81 MG PO TBEC: 81.0000 mg | DELAYED_RELEASE_TABLET | Freq: Every day | ORAL | Status: AC

## 2024-08-28 NOTE — Progress Notes (Signed)
 Cardiology Office Note:  .   Date:  08/30/2024 ID:  Rhonda Blair, DOB 09/19/1946, MRN [id number] PCP: Thurmond Cathlyn LABOR., MD [facility] Providers Cardiologist:  None   Patient Profile: .      PMH Hypertension Hyperlipidemia Coronary artery disease CT Calcium score 08/12/24 CAC Score 301 (74th percentile) LM 213, LAD 51, LCx 0, RCA 37 Hypothyroidism GERD       History of Present Illness: .    Discussed the use of AI scribe software for clinical note transcription with the patient, who gave verbal consent to proceed.  History of Present Illness Rhonda Blair is a very pleasant 78 year old female who presents for evaluation of coronary artery disease. She was referred by her gynecologist for proactive cardiac evaluation due to her family history and she recently underwent a CT Calcium score which was 301, placing her in 74th percentile for age/sex matched controls. Significant family history of heart disease, with her mother experiencing multiple transient ischemic attacks and undergoing bypass surgery at age 95, and her father dying of a massive heart attack at age 41. She is asymptomatic for heart-related issues and engages in regular physical activity, biking three mornings a week for up to eight and a half miles without any problems. Notes occasional acid reflux symptoms for which she takes TUMS. Her diet includes chicken, lean pork, some red meat, and a variety of vegetables. She avoids sodas and alcohol . She takes rosuvastatin four days a week for cholesterol management. BP is generally well-controlled with amlodipine , losartan , and hydrochlorothiazide , with occasional higher readings noted at home. She has never taken aspirin . She denies chest pain, shortness of breath, orthopnea, PND, edema, presyncope, syncope.  She is limited from walking due to back pain but feels well when cycling.  She previously took care of her mother and her first husband who both underwent bypass  surgery.  She remarried about 3 years ago and has enjoyed learning to cycle with him and gradually increasing her distance.  She is a retired Scientist, Forensic.  Family history: Her family history includes Alzheimer's disease in her brother; Diabetes in her paternal grandfather; Heart disease in her maternal grandmother, mother, and paternal grandmother; Heart disease (age of onset: 82) in her father; Hyperlipidemia in her father and mother; Hypertension in her father and mother; Stroke in her brother and mother.   Discussed the use of AI scribe software for clinical note transcription with the patient, who gave verbal consent to proceed.  ASCVD Risk Score: The 10-year ASCVD risk score (Arnett DK, et al., 2019) is: 32.5%   Values used to calculate the score:     Age: 24 years     Clincally relevant sex: Female     Is Non-Hispanic African American: No     Diabetic: No     Tobacco smoker: No     Systolic Blood Pressure: 136 mmHg     Is BP treated: Yes     HDL Cholesterol: 75 mg/dL     Total Cholesterol: 193 mg/dL   Diet: Simple meals, lots of chicken, lean pork  Rare red meat Lots of vegetables, salads, potatoes too Coffee, water  (bothered by reflux) No Etoh  Activity: Retired Scientist, Forensic from [employer] at [employer] after retirement Recently starting cycling 3 mornings per week 8 1/2 miles  No results found for: LIPOA    ROS: See HPI       Studies Reviewed: .  Risk Assessment/Calculations:          Physical Exam:   VS: BP 136/74 (BP Location: Right Arm, Patient Position: Sitting, Cuff Size: Normal)   Pulse 70   Ht 5' 3 (1.6 m)   Wt 145 lb (65.8 kg)   SpO2 98%   BMI 25.69 kg/m   Wt Readings from Last 3 Encounters:  08/28/24 145 lb (65.8 kg)  02/15/24 141 lb 1.5 oz (64 kg)  02/11/24 142 lb (64.4 kg)     GEN: Well nourished, well developed in no acute distress NECK: No JVD; No carotid bruits CARDIAC: RRR, no murmurs, rubs, gallops RESPIRATORY:  Clear to  auscultation without rales, wheezing or rhonchi  ABDOMEN: Soft, non-tender, non-distended EXTREMITIES:  No edema; No deformity     ASSESSMENT AND PLAN: .    Assessment & Plan Coronary artery calcification Cardiac risk CT score is 301, placing her in the 74th percentile for age/sex matched controls.  She is not able to walk for exercise but has started cycling over the past couple of years and now rides for about 8-1/2 miles 3 days a week.  Reports occasional symptoms of GERD for which she takes Tums.  Unclear if this is associated with increased activity.  She denies chest pain, dyspnea, or other symptoms concerning for angina.  She has significant family history with mother who underwent CABG and a father who died from a massive heart attack.  She had an EKG 01/2024 that did not show any concerning ST abnormalities.  Due to significant risk factors of hyperlipidemia, hypertension, age, and family history, we will pursue further ischemia evaluation. Discussed differences between calcified and soft plaque and the potential need for further testing.  BP is generally well-controlled.  LDL is not at goal as noted below. - Coronary CTA to evaluate for ischemia and for mapping of coronary anatomy  - Take Lopressor 100 mg 2 hours prior to CT  - Start daily aspirin  81 mg for prevention - Increase rosuvastatin to 10 mg daily for target LDL 70 or lower  Hyperlipidemia LDL goal < 70 Lipid panel completed 05/05/2024 with total cholesterol 193, triglycerides 77, HDL 75, and LDL-C 101. She states she was advised by another healthcare provider she could decrease rosuvastatin to 10 mg 4 days a week for unknown reasons.  From record review, it appears LDL was close to goal when taking rosuvastatin 10 mg daily. -Resume rosuvastatin 10 mg daily -Plan for repeat lipid testing in 2 to 3 months for surveillance -Check LP(a) for further risk stratification  Hypertension   BP generally well controlled with amlodipine ,  hydrochlorothiazide , and losartan , though occasional higher readings occur.  Renal function stable on labs completed 05/05/2024. - Continue current antihypertensive regimen  Gastroesophageal reflux disease   She reports occasional symptoms of heartburn and indigestion.  Not clearly associated with exertion, but could be considered as atypical angina.  Ischemia evaluation as noted above. - Recommended a trial of Gaviscon for reflux management       Dispo: 3 months with me  Signed, Rosaline Bane, NP-C

## 2024-08-28 NOTE — Patient Instructions (Signed)
 Medication Instructions:   START Aspirin  EC one (1) tablet by mouth ( 81 mg) daily.   CHANGE Rosuvastatin one (1) tablet by mouth (10 mg) daily.   *If you need a refill on your cardiac medications before your next appointment, please call your pharmacy*  Lab Work:  None ordered.  If you have labs (blood work) drawn today and your tests are completely normal, you will receive your results only by: MyChart Message (if you have MyChart) OR A paper copy in the mail If you have any lab test that is abnormal or we need to change your treatment, we will call you to review the results.  Testing/Procedures:    Your cardiac CT will be scheduled at one of the below locations:    [facility] [address]  [place], [place] [address]  If scheduled at the [facility] at [address], please enter the parking lot using the [address] entrance and use the FREE valet service at the patient drop-off area. Enter the building and check-in with registration on the main floor.  Please follow these instructions carefully (unless otherwise directed):  An IV will be required for this test and Nitroglycerin will be given.    On the Night Before the Test: Be sure to Drink plenty of water . Do not consume any caffeinated/decaffeinated beverages or chocolate 12 hours prior to your test. Do not take any antihistamines 12 hours prior to your test.  On the Day of the Test: Drink plenty of water  until 1 hour prior to the test. Do not eat any food 1 hour prior to test. You may take your regular medications prior to the test.  Take metoprolol (Lopressor) one ( 1 tablet) by mouth ( 100 mg) two hours prior to test. If you take Hydrochlorothiazide  please HOLD on the morning of the test. FEMALES- please wear underwire-free bra if available, avoid dresses & tight clothing  After the Test: Drink plenty of water . After receiving IV contrast, you may experience  a mild flushed feeling. This is normal. On occasion, you may experience a mild rash up to 24 hours after the test. This is not dangerous. If this occurs, you can take Benadryl  25 mg, Zyrtec, Claritin , or Allegra and increase your fluid intake. (Patients taking Tikosyn should avoid Benadryl , and may take Zyrtec, Claritin , or Allegra) If you experience trouble breathing, this can be serious. If it is severe call 911 IMMEDIATELY. If it is mild, please call our office.  We will call to schedule your test 2-4 weeks out understanding that some insurance companies will need an authorization prior to the service being performed.   For more information and frequently asked questions, please visit our website : [email or web address]  For non-scheduling related questions, please contact the cardiac imaging nurse navigator should you have any questions/concerns: Cardiac Imaging Nurse Navigators Direct Office Dial: [phone]   For scheduling needs, including cancellations and rescheduling, please call Brittany, [phone].   Follow-Up: At [facility], you and your health needs are our priority.  As part of our continuing mission to provide you with exceptional heart care, our providers are all part of one team.  This team includes your primary Cardiologist (physician) and Advanced Practice Providers or APPs (Physician Assistants and Nurse Practitioners) who all work together to provide you with the care you need, when you need it.  Your next appointment:   3 month(s)  Provider:   Rosaline Bane, NP  We recommend signing up for the patient portal called MyChart.  Sign up information is provided on this After Visit Summary.  MyChart is used to connect with patients for Virtual Visits (Telemedicine).  Patients are able to view lab/test results, encounter notes, upcoming appointments, etc.  Non-urgent messages can be sent to your provider as well.   To learn more about what  you can do with MyChart, go to [email or web address].   Other Instructions  Adopting a Healthy Lifestyle.   Weight: Know what a healthy weight is for you (roughly BMI <25) and aim to maintain this. You can calculate your body mass index on your smart phone. Unfortunately, this is not the most accurate measure of healthy weight, but it is the simplest measurement to use. A more accurate measurement involves body scanning which measures lean muscle, fat tissue and bony density. We do not have this equipment at [facility].    Diet: Aim for 7+ servings of fruits and vegetables daily Limit animal fats in diet for cholesterol and heart health - choose grass fed whenever available Avoid highly processed foods (fast food burgers, tacos, fried chicken, pizza, hot dogs, french fries)  Saturated fat comes in the form of butter, lard, coconut oil, margarine, partially hydrogenated oils, dairy products, and fat in meat. These increase your risk of cardiovascular disease.  Use healthy plant oils, such as olive, canola, soy, corn, sunflower and peanut.  Whole foods such as fruits, vegetables and whole grains have fiber  Men need > 38 grams of fiber per day Women need > 25 grams of fiber per day  Load up on vegetables and fruits - one-half of your plate: Aim for color and variety, and remember that potatoes dont count. Go for whole grains - one-quarter of your plate: Whole wheat, barley, wheat berries, quinoa, oats, brown rice, and foods made with them. If you want pasta, go with whole wheat pasta. Protein power - one-quarter of your plate: Fish, chicken, beans, and nuts are all healthy, versatile protein sources. Limit red meat. You need carbohydrates for energy! The type of carbohydrate is more important than the amount. Choose carbohydrates such as vegetables, fruits, whole grains, beans, and nuts in the place of white rice, white pasta, potatoes (baked or fried), macaroni and cheese, cakes, cookies, and  donuts.  If youre thirsty, drink water . Coffee and tea are good in moderation, but skip sugary drinks and limit milk and dairy products to one or two daily servings. Keep sugar intake at 6 teaspoons or 24 grams or LESS       Exercise: Aim for 150 min of moderate intensity exercise weekly for heart health, and weights twice weekly for bone health Stay active - any steps are better than no steps! Aim for 7-9 hours of sleep daily   Sleep: This provides your body with the reset and relaxation that it needs!  Aim to get 7-8 hours of sleep each night. Limit caffeine, screen time, and other distractions prior to bedtime.  Keep your bedroom cool and dark and do not wear heavy clothing to bed or use heavy bed covers - layer if needed.

## 2024-08-30 ENCOUNTER — Encounter (HOSPITAL_BASED_OUTPATIENT_CLINIC_OR_DEPARTMENT_OTHER): Payer: Self-pay | Admitting: Nurse Practitioner

## 2024-09-24 ENCOUNTER — Institutional Professional Consult (permissible substitution) (HOSPITAL_BASED_OUTPATIENT_CLINIC_OR_DEPARTMENT_OTHER): Admitting: Nurse Practitioner

## 2024-10-02 ENCOUNTER — Other Ambulatory Visit: Payer: Self-pay

## 2024-10-02 ENCOUNTER — Encounter: Payer: Self-pay | Admitting: Orthopaedic Surgery

## 2024-10-02 ENCOUNTER — Ambulatory Visit: Admitting: Orthopaedic Surgery

## 2024-10-02 DIAGNOSIS — Z96642 Presence of left artificial hip joint: Secondary | ICD-10-CM

## 2024-10-02 NOTE — Progress Notes (Signed)
 The patient is a very active 78 year old female who is now 7 months status post a left total hip arthroplasty.  She said that hip is doing well.  She also has a previous remote history of a right knee replacement.  She says left total hip is doing well and her right native hip occasionally has some pain.  She is walking without an assistive device  Her left operative hip moves smoothly and fluidly with no blocks or rotation.  She walks with a normal-appearing gait as well.  Her leg lengths appear equal.  An AP pelvis and lateral left hip shows a well-seated left total hip arthroplasty with no complicating features.  The right hip joint space shows only mild narrowing.  At this point follow-up for left hip can be as needed.  If she does develop any issues with the knee joints or other orthopedic complaints she knows to come and see us .  All questions and concerns were answered and addressed.

## 2024-10-29 ENCOUNTER — Telehealth (HOSPITAL_COMMUNITY): Payer: Self-pay | Admitting: *Deleted

## 2024-10-29 NOTE — Telephone Encounter (Signed)
 Attempted to call patient regarding upcoming cardiac CT appointment. Left message on voicemail with name and callback number Sid Seats RN Navigator Cardiac Imaging [facility] [phone] Office

## 2024-10-30 ENCOUNTER — Ambulatory Visit (INDEPENDENT_AMBULATORY_CARE_PROVIDER_SITE_OTHER)
Admission: RE | Admit: 2024-10-30 | Discharge: 2024-10-30 | Disposition: A | Discharge status: Home / Self Care | Source: Ambulatory Visit | Attending: Nurse Practitioner | Admitting: Nurse Practitioner

## 2024-10-30 DIAGNOSIS — R931 Abnormal findings on diagnostic imaging of heart and coronary circulation: Secondary | ICD-10-CM

## 2024-10-30 MED ORDER — NITROGLYCERIN 0.4 MG SL SUBL
0.8000 mg | SUBLINGUAL_TABLET | Freq: Once | SUBLINGUAL | Status: AC
  Administered 2024-10-30: 0.8 mg via SUBLINGUAL

## 2024-10-30 MED ORDER — IOHEXOL 350 MG/ML SOLN
100.0000 mL | Freq: Once | INTRAVENOUS | Status: AC | PRN
  Administered 2024-10-30: 100 mL via INTRAVENOUS

## 2024-11-02 ENCOUNTER — Ambulatory Visit (HOSPITAL_BASED_OUTPATIENT_CLINIC_OR_DEPARTMENT_OTHER): Payer: Self-pay | Admitting: Nurse Practitioner

## 2024-11-02 DIAGNOSIS — E785 Hyperlipidemia, unspecified: Secondary | ICD-10-CM

## 2024-11-02 DIAGNOSIS — I2510 Atherosclerotic heart disease of native coronary artery without angina pectoris: Secondary | ICD-10-CM

## 2024-11-02 DIAGNOSIS — Z7189 Other specified counseling: Secondary | ICD-10-CM

## 2024-11-02 DIAGNOSIS — R931 Abnormal findings on diagnostic imaging of heart and coronary circulation: Secondary | ICD-10-CM

## 2024-11-12 ENCOUNTER — Other Ambulatory Visit (HOSPITAL_BASED_OUTPATIENT_CLINIC_OR_DEPARTMENT_OTHER): Payer: Self-pay | Admitting: *Deleted

## 2024-11-12 DIAGNOSIS — R931 Abnormal findings on diagnostic imaging of heart and coronary circulation: Secondary | ICD-10-CM

## 2024-11-12 DIAGNOSIS — E785 Hyperlipidemia, unspecified: Secondary | ICD-10-CM

## 2024-11-12 DIAGNOSIS — I2510 Atherosclerotic heart disease of native coronary artery without angina pectoris: Secondary | ICD-10-CM

## 2024-11-12 DIAGNOSIS — Z7189 Other specified counseling: Secondary | ICD-10-CM

## 2024-11-27 ENCOUNTER — Ambulatory Visit (HOSPITAL_BASED_OUTPATIENT_CLINIC_OR_DEPARTMENT_OTHER): Admitting: Nurse Practitioner
